# Patient Record
Sex: Male | Born: 1935 | Race: White | Hispanic: No | Marital: Married | Smoking: Former smoker
Health system: Southern US, Community
[De-identification: ages and names within clinical notes are randomized; demographics above are authoritative.]

## PROBLEM LIST (undated history)

## (undated) DIAGNOSIS — J449 Chronic obstructive pulmonary disease, unspecified: Secondary | ICD-10-CM

## (undated) DIAGNOSIS — I5022 Chronic systolic (congestive) heart failure: Secondary | ICD-10-CM

## (undated) DIAGNOSIS — N184 Chronic kidney disease, stage 4 (severe): Secondary | ICD-10-CM

## (undated) DIAGNOSIS — R918 Other nonspecific abnormal finding of lung field: Secondary | ICD-10-CM

## (undated) DIAGNOSIS — I2510 Atherosclerotic heart disease of native coronary artery without angina pectoris: Secondary | ICD-10-CM

## (undated) DIAGNOSIS — R1310 Dysphagia, unspecified: Secondary | ICD-10-CM

## (undated) DIAGNOSIS — I10 Essential (primary) hypertension: Secondary | ICD-10-CM

## (undated) DIAGNOSIS — Z86711 Personal history of pulmonary embolism: Secondary | ICD-10-CM

## (undated) DIAGNOSIS — I255 Ischemic cardiomyopathy: Secondary | ICD-10-CM

## (undated) DIAGNOSIS — I6529 Occlusion and stenosis of unspecified carotid artery: Secondary | ICD-10-CM

## (undated) DIAGNOSIS — R634 Abnormal weight loss: Secondary | ICD-10-CM

## (undated) HISTORY — DX: Occlusion and stenosis of unspecified carotid artery: I65.29

## (undated) HISTORY — DX: Personal history of pulmonary embolism: Z86.711

## (undated) HISTORY — DX: Abnormal weight loss: R63.4

## (undated) HISTORY — DX: Other nonspecific abnormal finding of lung field: R91.8

## (undated) HISTORY — DX: Ischemic cardiomyopathy: I25.5

## (undated) HISTORY — DX: Chronic systolic (congestive) heart failure: I50.22

## (undated) HISTORY — DX: Chronic obstructive pulmonary disease, unspecified: J44.9

## (undated) HISTORY — DX: Dysphagia, unspecified: R13.10

## (undated) HISTORY — PX: PROSTATE SURGERY: SHX751

---

## 1993-03-13 HISTORY — PX: CORONARY ARTERY BYPASS GRAFT: SHX141

## 2001-11-29 ENCOUNTER — Inpatient Hospital Stay (HOSPITAL_COMMUNITY): Admission: RE | Admit: 2001-11-29 | Discharge: 2001-12-01 | Payer: Self-pay | Admitting: Urology

## 2002-04-25 ENCOUNTER — Ambulatory Visit (HOSPITAL_COMMUNITY): Admission: RE | Admit: 2002-04-25 | Discharge: 2002-04-25 | Payer: Self-pay | Admitting: Internal Medicine

## 2002-05-16 ENCOUNTER — Ambulatory Visit (HOSPITAL_COMMUNITY): Admission: RE | Admit: 2002-05-16 | Discharge: 2002-05-16 | Payer: Self-pay | Admitting: General Surgery

## 2005-03-13 HISTORY — PX: CARDIAC CATHETERIZATION: SHX172

## 2005-04-14 ENCOUNTER — Ambulatory Visit: Payer: Self-pay | Admitting: Cardiology

## 2005-04-26 ENCOUNTER — Ambulatory Visit: Payer: Self-pay | Admitting: Cardiology

## 2005-05-03 ENCOUNTER — Inpatient Hospital Stay (HOSPITAL_BASED_OUTPATIENT_CLINIC_OR_DEPARTMENT_OTHER): Admission: RE | Admit: 2005-05-03 | Discharge: 2005-05-03 | Payer: Self-pay | Admitting: Internal Medicine

## 2005-05-03 ENCOUNTER — Ambulatory Visit: Payer: Self-pay | Admitting: Internal Medicine

## 2005-05-05 ENCOUNTER — Ambulatory Visit: Payer: Self-pay | Admitting: Cardiology

## 2005-05-10 ENCOUNTER — Ambulatory Visit: Payer: Self-pay | Admitting: Cardiology

## 2005-05-18 ENCOUNTER — Ambulatory Visit: Payer: Self-pay | Admitting: Cardiology

## 2005-10-20 ENCOUNTER — Ambulatory Visit: Payer: Self-pay | Admitting: Cardiology

## 2009-02-06 ENCOUNTER — Encounter: Payer: Self-pay | Admitting: Cardiology

## 2009-02-26 ENCOUNTER — Encounter: Payer: Self-pay | Admitting: Cardiology

## 2009-02-28 ENCOUNTER — Encounter: Payer: Self-pay | Admitting: Cardiology

## 2009-03-01 ENCOUNTER — Ambulatory Visit: Payer: Self-pay | Admitting: Cardiology

## 2009-03-01 ENCOUNTER — Encounter: Payer: Self-pay | Admitting: Cardiology

## 2009-03-02 ENCOUNTER — Encounter: Payer: Self-pay | Admitting: Cardiology

## 2009-03-03 ENCOUNTER — Encounter: Payer: Self-pay | Admitting: Cardiology

## 2009-03-04 ENCOUNTER — Encounter: Payer: Self-pay | Admitting: Cardiology

## 2009-03-19 ENCOUNTER — Ambulatory Visit: Payer: Self-pay | Admitting: Cardiology

## 2009-03-19 DIAGNOSIS — I6529 Occlusion and stenosis of unspecified carotid artery: Secondary | ICD-10-CM | POA: Insufficient documentation

## 2009-03-19 DIAGNOSIS — E785 Hyperlipidemia, unspecified: Secondary | ICD-10-CM | POA: Insufficient documentation

## 2009-03-19 DIAGNOSIS — I739 Peripheral vascular disease, unspecified: Secondary | ICD-10-CM

## 2009-03-19 DIAGNOSIS — R9430 Abnormal result of cardiovascular function study, unspecified: Secondary | ICD-10-CM | POA: Insufficient documentation

## 2009-03-19 DIAGNOSIS — I5042 Chronic combined systolic (congestive) and diastolic (congestive) heart failure: Secondary | ICD-10-CM | POA: Insufficient documentation

## 2009-03-19 DIAGNOSIS — I5041 Acute combined systolic (congestive) and diastolic (congestive) heart failure: Secondary | ICD-10-CM | POA: Insufficient documentation

## 2009-03-19 DIAGNOSIS — Z86718 Personal history of other venous thrombosis and embolism: Secondary | ICD-10-CM | POA: Insufficient documentation

## 2009-03-19 DIAGNOSIS — I10 Essential (primary) hypertension: Secondary | ICD-10-CM

## 2009-03-19 DIAGNOSIS — I129 Hypertensive chronic kidney disease with stage 1 through stage 4 chronic kidney disease, or unspecified chronic kidney disease: Secondary | ICD-10-CM

## 2009-03-19 DIAGNOSIS — Z951 Presence of aortocoronary bypass graft: Secondary | ICD-10-CM | POA: Insufficient documentation

## 2009-03-26 ENCOUNTER — Encounter: Payer: Self-pay | Admitting: Cardiology

## 2009-03-31 ENCOUNTER — Encounter: Payer: Self-pay | Admitting: Cardiology

## 2009-04-08 ENCOUNTER — Encounter: Payer: Self-pay | Admitting: Cardiology

## 2009-04-12 ENCOUNTER — Encounter: Payer: Self-pay | Admitting: Cardiology

## 2009-04-13 ENCOUNTER — Ambulatory Visit: Payer: Self-pay | Admitting: Cardiology

## 2009-04-13 DIAGNOSIS — N189 Chronic kidney disease, unspecified: Secondary | ICD-10-CM

## 2009-04-13 DIAGNOSIS — R109 Unspecified abdominal pain: Secondary | ICD-10-CM

## 2009-04-14 ENCOUNTER — Encounter: Payer: Self-pay | Admitting: Cardiology

## 2009-04-14 ENCOUNTER — Ambulatory Visit: Payer: Self-pay | Admitting: Cardiology

## 2009-04-14 ENCOUNTER — Inpatient Hospital Stay (HOSPITAL_COMMUNITY): Admission: EM | Admit: 2009-04-14 | Discharge: 2009-04-16 | Payer: Self-pay | Admitting: Emergency Medicine

## 2009-04-15 ENCOUNTER — Encounter: Payer: Self-pay | Admitting: Cardiology

## 2009-04-15 ENCOUNTER — Telehealth (INDEPENDENT_AMBULATORY_CARE_PROVIDER_SITE_OTHER): Payer: Self-pay | Admitting: *Deleted

## 2009-04-15 ENCOUNTER — Encounter (INDEPENDENT_AMBULATORY_CARE_PROVIDER_SITE_OTHER): Payer: Self-pay | Admitting: Internal Medicine

## 2009-04-16 ENCOUNTER — Encounter: Payer: Self-pay | Admitting: Cardiology

## 2009-04-16 ENCOUNTER — Encounter (INDEPENDENT_AMBULATORY_CARE_PROVIDER_SITE_OTHER): Payer: Self-pay | Admitting: *Deleted

## 2009-04-17 ENCOUNTER — Encounter: Payer: Self-pay | Admitting: Cardiology

## 2009-04-18 ENCOUNTER — Encounter: Payer: Self-pay | Admitting: Physician Assistant

## 2009-04-18 ENCOUNTER — Ambulatory Visit: Payer: Self-pay | Admitting: Cardiology

## 2009-05-18 ENCOUNTER — Ambulatory Visit: Payer: Self-pay | Admitting: Cardiology

## 2009-05-19 ENCOUNTER — Encounter: Payer: Self-pay | Admitting: Cardiology

## 2009-06-03 ENCOUNTER — Ambulatory Visit: Payer: Self-pay | Admitting: Cardiology

## 2009-06-03 DIAGNOSIS — I951 Orthostatic hypotension: Secondary | ICD-10-CM

## 2009-06-21 ENCOUNTER — Inpatient Hospital Stay (HOSPITAL_COMMUNITY): Admission: EM | Admit: 2009-06-21 | Discharge: 2009-06-24 | Payer: Self-pay | Admitting: Emergency Medicine

## 2009-06-21 ENCOUNTER — Ambulatory Visit: Payer: Self-pay | Admitting: Surgery

## 2009-06-21 ENCOUNTER — Encounter: Payer: Self-pay | Admitting: Cardiology

## 2009-06-22 ENCOUNTER — Encounter: Payer: Self-pay | Admitting: Cardiology

## 2009-06-22 ENCOUNTER — Ambulatory Visit: Payer: Self-pay | Admitting: Vascular Surgery

## 2009-06-23 ENCOUNTER — Encounter: Payer: Self-pay | Admitting: Cardiology

## 2009-06-24 ENCOUNTER — Encounter: Payer: Self-pay | Admitting: Cardiology

## 2009-06-28 ENCOUNTER — Ambulatory Visit (HOSPITAL_COMMUNITY): Admission: RE | Admit: 2009-06-28 | Discharge: 2009-06-28 | Payer: Self-pay | Admitting: Vascular Surgery

## 2009-07-12 ENCOUNTER — Ambulatory Visit: Payer: Self-pay | Admitting: Surgery

## 2010-04-12 NOTE — Miscellaneous (Signed)
Summary: Orders Update  Clinical Lists Changes  Orders: Added new Referral order of CT Scan  (CT Scan) - Signed 

## 2010-04-12 NOTE — Letter (Signed)
Summary: Discharge Summary  Discharge Summary   Imported By: Dorise Hiss 06/28/2009 15:25:12  _____________________________________________________________________  External Attachment:    Type:   Image     Comment:   External Document

## 2010-04-12 NOTE — Assessment & Plan Note (Signed)
Summary: Brett Baldwin   Visit Type:  Follow-up Primary Provider:  Linna Darner   History of Present Illness: the patient is a 77 year old male is coronary artery disease, status post coronary bypass grafting. The patient was last evaluated on March 19, 2009. He presents for a post hospital visit. He was admitted with a sensation of choking and cough. During the hospital he had an endoscopy performed and states that after this he felt better. No esophageal dilatation however was done. A mild elevation in his cardiac troponin was noted. Cardiolite study showed an old lateral scar but no significant ischemia. Ejection fraction was 45%. The patient also has a history of urosepsis and recurrent UTI. No abnormalities on cystoscopy have been found.  When I saw the patient he office he was noted to have a significant carotid bruit on the left side. He was briefly evaluated in 1995 at [facility] for high-grade occlusion in the right carotid artery, versus possible occlusion and was told that there was no surgical option. I referred the patient for a CTA of the head and neck. He was found to have the right common carotid artery and right internal carotid artery to be occluded. No contiguous string sign was detected. There was prominent irregular calcified plaque noted in the left carotid artery with an estimated 60% diameter stenosis. There was also 75% stenosis in the proximal left vertebral artery. he has a followup appointment scheduled with vascular surgery on February 14.Incidentally on CT scan he was found to have pulmonary nodules in the right apex respectively 3.6 mm and 3.4 mm the latter in the left lung. Recommendation was to followup with a CT scan in one year.  From a cardiac perspective the patient however currently denies any chest pain he has no orthopnea or PND. His NYHA class 1/2. He does report left flank pain. He denies any fever or chills.he also reports bilateral hip pain. The patient reports fatigue and  generalized weakness.the patient reports that he has had an abnormal ultrasound of the kidneys although we do not have the results available. His most recent creatinine was 2.0.  Current Problems (verified): 1)  Pvd  (ICD-443.9) 2)  Essential Hypertension, Benign  (ICD-401.1) 3)  Carotid Artery Disease  (ICD-433.10) 4)  Nonspecific Abnormal Unspec Cv Function Study  (ICD-794.30) 5)  Chronic Comb Systolic&diastolic Heart Failure  (ICD-428.42) 6)  CHF  (ICD-428.0) 7)  Hyperlipidemia  (ICD-272.4) 8)  Htn Ckd Uns W/ckd Stage I Thru Stage Iv/uns  (ICD-403.90) 9)  Postsurgical Aortocoronary Bypass Status  (ICD-V45.81) 10)  Personal History, Venous Thrombosis and Embolism  (ICD-V12.51) 11)  Occlusion&stenos Carotid Art w/o Mention Infarct  (ICD-433.10)  Current Medications (verified): 1)  Aspir-Trin 325 Mg Tbec (Aspirin) .... Take 1 Tablet By Mouth Once A Day 2)  Simvastatin 80 Mg Tabs (Simvastatin) .... Take 1 Tablet By Mouth Once A Day 3)  Fioricet 50-325-40 Mg Tabs (Butalbital-Apap-Caffeine) .... Two Times A Day As Needed 4)  Furosemide 40 Mg Tabs (Furosemide) .... Take 1/2 Tab Daily (20mg ) 5)  Bisoprolol Fumarate 5 Mg Tabs (Bisoprolol Fumarate) .... Take 1/2 Tablet By Mouth Once A Day 6)  Flomax 0.4 Mg Caps (Tamsulosin Hcl) .... Take 1 Tablet By Mouth Once A Day 7)  Imdur 30 Mg Xr24h-Tab (Isosorbide Mononitrate) .... Take 1 Tablet By Mouth Once A Day 8)  Losartan Potassium 50 Mg Tabs (Losartan Potassium) .... Take 1 Tablet By Mouth Once A Day 9)  Flonase 50 Mcg/act Susp (Fluticasone Propionate) .... One Spray Each Nostril  Two Times A Day 10)  Zyrtec Allergy 10 Mg Tabs (Cetirizine Hcl) .... Take 1 Tablet By Mouth Once A Day 11)  Amlodipine Besylate 2.5 Mg Tabs (Amlodipine Besylate) .... Take 1 Tablet By Mouth Once A Day  Allergies (verified): No Known Drug Allergies  Past History:  Past Medical History: Last updated: 03/19/2009 Hypertension Hx of Coronary disease with cath in 2007  revealing patient grafts CABG done in 1995 Hx of PE in the past but no pulmonary embolus on the current CT Scan as of 03/01/09 carotid artery stenosis COPD Swallowing difficulties Hx. of EF 30%  Past Surgical History: Last updated: 03/19/2009 CABG  1995 CAD H/O bypass prostate infections  Family History: Last updated: 03/19/2009 NEGATIVE FOR SEVERE CORONARY DISEAsE  Social History: Last updated: 03/19/2009 Married  Alcohol Use - no Drug Use - no  Risk Factors: Smoking Status: quit (03/19/2009)  Review of Systems       The patient complains of fatigue, decreased hearing, prolonged cough, muscle weakness, and joint pain.  The patient denies malaise, fever, weight gain/loss, vision loss, hoarseness, chest pain, palpitations, shortness of breath, wheezing, sleep apnea, coughing up blood, abdominal pain, blood in stool, nausea, vomiting, diarrhea, heartburn, incontinence, blood in urine, leg swelling, rash, skin lesions, headache, fainting, dizziness, depression, anxiety, enlarged lymph nodes, easy bruising or bleeding, and environmental allergies.    Vital Signs:  Patient profile:   77 year old male Weight:      150 pounds Pulse rate:   66 / minute BP sitting:   167 / 68  (right arm)  Vitals Entered By: Dreama Saa, CNA (April 13, 2009 9:54 AM)  Physical Exam  Additional Exam:  General: Well-developed, well-nourished in no distress head: Normocephalic and atraumatic eyes PERRLA/EOMI intact, conjunctiva and lids normal nose: No deformity or lesions mouth normal dentition, normal posterior pharynx neck: Supple, no JVD.  No masses, thyromegaly or abnormal cervical nodes. Left carotid bruit lungs: Normal breath sounds bilaterally without wheezing.  Normal percussion heart: regular rate and rhythm with normal S1 and S2, no S3 or S4.  PMI is normal.  No pathological murmurs abdomen: Normal bowel sounds, abdomen is soft and nontender without masses, organomegaly or hernias  noted.  No hepatosplenomegaly musculoskeletal: Back normal, normal gait muscle strength and tone normal pulsus:1+ dorsalis pedis and posterior tibial pulses. Extremities: No peripheral pitting edema neurologic: Alert and oriented x 3 skin: Intact without lesions or rashes cervical nodes: No significant adenopathy psychologic: Normal affect    Impression & Recommendations:  Problem # 1:  ESSENTIAL HYPERTENSION, BENIGN (ICD-401.1) the patient's blood pressure appears to be poorly controlled today. However I requested several blood pressure readings before making changes. In light of his renal dysfunction, the patient will also be referred to nephrology. I anticipate that we will make further changes. Although the patient is on Cozaar this does not cause a further increase in his creatinine. The patient does have significant LV dysfunction with an ejection fraction of 35% and benefits from an ARB. Lisinopril was discontinued due to possible cough, although the latter may be multifactorial. His updated medication list for this problem includes:    Aspir-trin 325 Mg Tbec (Aspirin) .Marland Kitchen... Take 1 tablet by mouth once a day    Furosemide 40 Mg Tabs (Furosemide) .Marland Kitchen... Take 1/2 tab daily (20mg )    Bisoprolol Fumarate 5 Mg Tabs (Bisoprolol fumarate) .Marland Kitchen... Take 1/2 tablet by mouth once a day    Losartan Potassium 50 Mg Tabs (Losartan potassium) .Marland Kitchen... Take  1 tablet by mouth once a day    Amlodipine Besylate 2.5 Mg Tabs (Amlodipine besylate) .Marland Kitchen... Take 1 tablet by mouth once a day  Problem # 2:  CAROTID ARTERY DISEASE (ICD-433.10) the patient has followup scheduled with vascular surgery. His updated medication list for this problem includes:    Aspir-trin 325 Mg Tbec (Aspirin) .Marland Kitchen... Take 1 tablet by mouth once a day  Problem # 3:  NONSPECIFIC ABNORMAL UNSPEC CV FUNCTION STUDY (ICD-794.30) the patient had an abnormal Cardiolite stress study but with evidence of old lateral scar. He denies any substernal  chest pain. It appears to have no ischemia.  Problem # 4:  CHRONIC COMB SYSTOLIC&DIASTOLIC HEART FAILURE (ICD-428.42) the patient has LV dysfunction with an ejection fraction of 35%. We will recheck an echocardiogram in 3 months to reassess his LV function. Currently however he has no heart failure symptoms. His updated medication list for this problem includes:    Aspir-trin 325 Mg Tbec (Aspirin) .Marland Kitchen... Take 1 tablet by mouth once a day    Furosemide 40 Mg Tabs (Furosemide) .Marland Kitchen... Take 1/2 tab daily (20mg )    Bisoprolol Fumarate 5 Mg Tabs (Bisoprolol fumarate) .Marland Kitchen... Take 1/2 tablet by mouth once a day    Imdur 30 Mg Xr24h-tab (Isosorbide mononitrate) .Marland Kitchen... Take 1 tablet by mouth once a day    Losartan Potassium 50 Mg Tabs (Losartan potassium) .Marland Kitchen... Take 1 tablet by mouth once a day    Amlodipine Besylate 2.5 Mg Tabs (Amlodipine besylate) .Marland Kitchen... Take 1 tablet by mouth once a day  Problem # 5:  HYPERLIPIDEMIA (ICD-272.4) the patient is on high-dose statin drug therapy. He can follow up with his primary care physician regarding liver function tests and lipid panel. His updated medication list for this problem includes:    Simvastatin 80 Mg Tabs (Simvastatin) .Marland Kitchen... Take 1 tablet by mouth once a day  Problem # 6:  FLANK PAIN, LEFT (ICD-789.09) have ordered urinalysis and culture and sensitivity due to the patient's history of recurrent UTIs. Orders: T-Urinalysis ([id number])  Problem # 7:  CHRONIC KIDNEY DISEASE UNSPECIFIED (ICD-585.9) the patient will be referred to nephrology. Creatinine has remained stable around 2 on the current medical regimen.  Patient Instructions: 1)  2D-Echo in 3 months (before next visit) 2)  Go to [facility] for routine urinalysis 3)  Follow up in  3 months.

## 2010-04-12 NOTE — Consult Note (Signed)
Summary: [facility]   [facility]   Imported By: Roderic Ovens 05/04/2009 14:13:00  _____________________________________________________________________  External Attachment:    Type:   Image     Comment:   External Document

## 2010-04-12 NOTE — Progress Notes (Signed)
Summary: POSITIVE UTI  Phone Note From Other Clinic Call back at [phone]   Caller: Mckinley Jewel Summary of Call: positive UTI,report to be faxed. Initial call taken by: Carlye Grippe,  April 15, 2009 8:14 AM

## 2010-04-12 NOTE — Consult Note (Signed)
Summary: Consultation Report  Consultation Report   Imported By: Dorise Hiss 06/28/2009 15:28:20  _____________________________________________________________________  External Attachment:    Type:   Image     Comment:   External Document

## 2010-04-12 NOTE — Letter (Signed)
Summary: External Correspondence/ FAXED VASCULAR&VEIN  External Correspondence/ FAXED VASCULAR&VEIN   Imported By: Dorise Hiss 04/20/2009 09:35:01  _____________________________________________________________________  External Attachment:    Type:   Image     Comment:   External Document

## 2010-04-12 NOTE — Assessment & Plan Note (Signed)
Summary: eph-post [facility] per Myrtis Ser 2 wks   Visit Type:  hospital follow-up Primary Provider:  Linna Darner  CC:  hospital follow-up visit.  History of Present Illness: the patient is a 77 year old male with history of coronary artery disease status post coronary bypass grafting. The patient was seen in the hospital by Dr. Myrtis Ser. He came in with the sensation of choking and cough. He had an endoscopy performed and states that after this he felt better. It does not appear to have an esophageal dilatation done. Cardiac troponins were drawn and were respectively 0.16, weight 1:30, 0.13. The patient did not report any substernal chest pain. There were no acute ischemic EKG changes. Dr. Myrtis Ser did decide to perform a Cardiolite scan. This study was abnormal with old lateral scar but significant ischemia he in several segments. Ejection fraction was approximately 45%. The patient was referred to me for followup. He recently was seen by the urologist after several episodes of urosepsis and recurrent UTI. He had a cystoscopy performed no acute abnormalities. Of note is also the patient has history of carotid artery disease with complete occlusion of the right carotid and a high grade lesion in the left carotid. This was diagnosed in 1995 it was felt that the patient had no surgical options. This decision was rendered by [facility]. The patient a catheterization in 2007 with patent grafts with significant distal disease.  The patient reports still a chronic cough. He also significant nasal congestion. He reports no claudication. He denies any chest pain but at rest or on exertion. He is an NYHA class 1/2. He denies any orthopnea or PND.  Preventive Screening-Counseling & Management  Alcohol-Tobacco     Smoking Status: quit     Year Quit: 1978  Clinical Review Panels:  CXR CXR results  CHEST - 2 VIEW                             Comparison: 12/23/2008                   Findings: Prior CABG.  Heart and mediastinal contours are within         normal limits.  No focal opacities or effusions.  No acute bony         abnormality.                   IMPRESSION:         No acute cardiopulmonary disease.                              ** REPORT SIGNED IN OTHER VENDOR SYSTEM  **                        Reported By: Cyndie Chime, MD      (02/28/2009)  Cardiac Imaging Cardiac Cath Findings                              CARDIAC CATHETERIZATION  Left ventriculogram done the RAO position using a hand injection to minimize  dye showed an EF of about 35% with inferior akinesis.   ASSESSMENT:  1.  Severe three-vessel native coronary disease.  2.  All three of his bypass grafts were open with severe diffuse distal      disease in  the native vessels as well as branch vessel disease.  3.  Left ventricular ejection fraction appears to be 35% by hand injection      of the left ventricular. The left ventricular filling pressure is      normal.   PLAN:  We will continue with medical therapy as per Dr. Andee Lineman. I would  consider obtaining echocardiogram to further evaluate the ejection fraction.   Arvilla Meres, M.D. [facility] (05/03/2005)    Current Medications (verified): 1)  Aspir-Trin 325 Mg Tbec (Aspirin) .... Take 1 Tablet By Mouth Once A Day 2)  Simvastatin 80 Mg Tabs (Simvastatin) .... Take 1 Tablet By Mouth Once A Day 3)  Fioricet 50-325-40 Mg Tabs (Butalbital-Apap-Caffeine) .... Two Times A Day As Needed 4)  Furosemide 40 Mg Tabs (Furosemide) .... Take 1/2 Tab Daily (20mg ) 5)  Bisoprolol Fumarate 5 Mg Tabs (Bisoprolol Fumarate) .... Take 1/2 Tablet By Mouth Once A Day 6)  Flomax 0.4 Mg Caps (Tamsulosin Hcl) .... Take 1 Tablet By Mouth Once A Day 7)  Imdur 30 Mg Xr24h-Tab (Isosorbide Mononitrate) .... Take 1 Tablet By Mouth Once A Day 8)  Losartan Potassium 50 Mg Tabs (Losartan Potassium) .... Take 1 Tablet By Mouth Once A Day 9)  Flonase 50 Mcg/act Susp (Fluticasone Propionate) ....  One Spray Each Nostril Two Times A Day 10)  Zyrtec Allergy 10 Mg Tabs (Cetirizine Hcl) .... Take 1 Tablet By Mouth Once A Day  Allergies (verified): No Known Drug Allergies  Comments:  Nurse/Medical Assistant: The patient's medications and allergies were reviewed with the patient and were updated in the Medication and Allergy Lists. List brought.  Past History:  Past Medical History: Last updated: 03/19/2009 Hypertension Hx of Coronary disease with cath in 2007 revealing patient grafts CABG done in 1995 Hx of PE in the past but no pulmonary embolus on the current CT Scan as of 03/01/09 carotid artery stenosis COPD Swallowing difficulties Hx. of EF 30%  Past Surgical History: Last updated: 03/19/2009 CABG  1995 CAD H/O bypass prostate infections  Family History: Last updated: 03/19/2009 NEGATIVE FOR SEVERE CORONARY DISEAsE  Social History: Last updated: 03/19/2009 Married  Alcohol Use - no Drug Use - no  Risk Factors: Smoking Status: quit (03/19/2009)  Social History: Smoking Status:  quit  Review of Systems       The patient complains of blood in urine.  The patient denies fatigue, malaise, fever, weight gain/loss, vision loss, decreased hearing, hoarseness, chest pain, palpitations, shortness of breath, prolonged cough, wheezing, sleep apnea, coughing up blood, abdominal pain, blood in stool, nausea, vomiting, diarrhea, heartburn, incontinence, muscle weakness, joint pain, leg swelling, rash, skin lesions, headache, fainting, dizziness, depression, anxiety, enlarged lymph nodes, easy bruising or bleeding, and environmental allergies.         swallowing difficulty  Vital Signs:  Patient profile:   77 year old male Height:      67 inches Weight:      147 pounds Pulse rate:   62 / minute BP sitting:   161 / 70  (left arm) Cuff size:   regular  Vitals Entered By: Carlye Grippe (March 19, 2009 11:30 AM) CC: hospital follow-up visit   Physical  Exam  Additional Exam:  General: Well-developed, well-nourished in no distress head: Normocephalic and atraumatic eyes PERRLA/EOMI intact, conjunctiva and lids normal nose: No deformity or lesions mouth normal dentition, normal posterior pharynx neck: Supple, no JVD.  No masses, thyromegaly or abnormal cervical nodes.  The patient has a  right-sided carotid bruit no bruit is heard on the left side. lungs: Normal breath sounds bilaterally without wheezing.  Normal percussion heart: regular rate and rhythm with normal S1 and S2, no S3 or S4.  PMI is normal.  No pathological murmurs abdomen: Normal bowel sounds, abdomen is soft and nontender without masses, organomegaly or hernias noted.  No hepatosplenomegaly musculoskeletal: Back normal, normal gait muscle strength and tone normal pulsus: Pulse is normal in all 4 extremities Extremities: No peripheral pitting edema neurologic: Alert and oriented x 3 skin: Intact without lesions or rashes cervical nodes: No significant adenopathy psychologic: Normal affect    Impression & Recommendations:  Problem # 1:  ESSENTIAL HYPERTENSION, BENIGN (ICD-401.1) the patient has poorly controlled blood pressure. I made significant changes in his medical regimen. In particular he had discontinued low dose lisinopril in favor of higher dose Cozaar. The former may be causing the patient to cough. Also gave him prescription for Flonase and Zyrtec as he clinically doubtful he has a postnasal drip. His updated medication list for this problem includes:    Aspir-trin 325 Mg Tbec (Aspirin) .Marland Kitchen... Take 1 tablet by mouth once a day    Furosemide 40 Mg Tabs (Furosemide) .Marland Kitchen... Take 1/2 tab daily (20mg )    Bisoprolol Fumarate 5 Mg Tabs (Bisoprolol fumarate) .Marland Kitchen... Take 1/2 tablet by mouth once a day    Losartan Potassium 50 Mg Tabs (Losartan potassium) .Marland Kitchen... Take 1 tablet by mouth once a day  Problem # 2:  CAROTID ARTERY DISEASE (ICD-433.10) the patient has severe  carotid artery disease .he has apparently a known occlusion of the left side an 80% right-sided carotid artery disease.  The patient will be scheduled for a CT scan/angiography of the head neck and upper chest.  This will provide a roadmap of an evaluation of his carotid arteries and cerebral vessels.  The patient will need a referral likely to vascular surgery. His updated medication list for this problem includes:    Aspir-trin 325 Mg Tbec (Aspirin) .Marland Kitchen... Take 1 tablet by mouth once a day  Problem # 3:  NONSPECIFIC ABNORMAL UNSPEC CV FUNCTION STUDY (ICD-794.30) although the patient has an abnormal cardiovascular function study he currently has no chest pain.  He had catheterization done in 2007 which showed patent grafts but significant distal disease.  He also has an ejection fraction of 35%.the patient will be started on indoor in part for blood pressure control.  Given his low ejection fraction we will discontinue Pletal.  He has no evidence of claudication in any event.  I also changed the patient's lisinopril to generic Cozaar.  He does report cough although I think this may be likely secondary to postnasal drip  Problem # 4:  CHRONIC COMB SYSTOLIC&DIASTOLIC HEART FAILURE (ICD-428.42) ejection fraction 35% with adjustments in medications in addition to Cozaar to 50 monos p.o. daily.  Given low ejection fraction Pletal will be discontinued. His updated medication list for this problem includes:    Aspir-trin 325 Mg Tbec (Aspirin) .Marland Kitchen... Take 1 tablet by mouth once a day    Furosemide 40 Mg Tabs (Furosemide) .Marland Kitchen... Take 1/2 tab daily (20mg )    Bisoprolol Fumarate 5 Mg Tabs (Bisoprolol fumarate) .Marland Kitchen... Take 1/2 tablet by mouth once a day    Imdur 30 Mg Xr24h-tab (Isosorbide mononitrate) .Marland Kitchen... Take 1 tablet by mouth once a day    Losartan Potassium 50 Mg Tabs (Losartan potassium) .Marland Kitchen... Take 1 tablet by mouth once a day  Problem # 5:  PVD (ICD-443.9)  No claudication. D/C Pletal with LV dysfunction.    Problem # 6:  POSTSURGICAL AORTOCORONARY BYPASS STATUS (ICD-V45.81) stable with no recurrent angina.  Patient Instructions: 1)  Imdur 30mg  daily 2)  Stop Pletal 3)  Stop Lisinopril 4)  Change to generic Cozaar 50mg  daily 5)  Change to Lasix 20mg  daily (1/2 tab) 6)  CT Scan of head, neck, and upper chest 7)  CTA to evaluate carotid arteries/cerebral vessels 8)  Follow up in  1 month  Prescriptions: FLONASE 50 MCG/ACT SUSP (FLUTICASONE PROPIONATE) one spray each nostril two times a day  #1 x 1   Entered by:   Hoover Brunette, LPN   Authorized by:   Lewayne Bunting, MD, [facility]   Signed by:   Hoover Brunette, LPN on 98/01/9146   Method used:   Electronically to        [facility] (retail)       [address]       [place], [place]  [address]       Ph: [phone]       Fax: [phone]   RxID:   [id number] LOSARTAN POTASSIUM 50 MG TABS (LOSARTAN POTASSIUM) Take 1 tablet by mouth once a day  #30 x 6   Entered by:   Hoover Brunette, LPN   Authorized by:   Lewayne Bunting, MD, [facility]   Signed by:   Hoover Brunette, LPN on 03/47/4259   Method used:   Electronically to        [facility] (retail)       [address]       [place], [place]  [address]       Ph: [phone]       Fax: [phone]   RxID:   [id number] IMDUR 30 MG XR24H-TAB (ISOSORBIDE MONONITRATE) Take 1 tablet by mouth once a day  #30 x 6   Entered by:   Hoover Brunette, LPN   Authorized by:   Lewayne Bunting, MD, [facility]   Signed by:   Hoover Brunette, LPN on 54/27/0623   Method used:   Electronically to        [facility] (retail)       [address]       [place], [place]  [address]       Ph: [phone]       Fax: [phone]   RxID:   [id number]

## 2010-04-12 NOTE — Letter (Signed)
Summary: Discharge Summary  Discharge Summary   Imported By: Zachary George 03/19/2009 09:38:29  _____________________________________________________________________  External Attachment:    Type:   Image     Comment:   External Document

## 2010-04-12 NOTE — Consult Note (Signed)
Summary: CARDIOLOGY CONSULT/[facility]  [facility]OLOGY [facility]SULT/[facility]   Imported By: Zachary George 03/19/2009 09:37:59  _____________________________________________________________________  External Attachment:    Type:   Image     Comment:   External Document

## 2010-04-12 NOTE — Consult Note (Signed)
Summary: CARDIOLOGY CONSULT/ [facility]  [facility]OLOG[facility]/ [facility]   Imported By: Zachary George 06/02/2009 14:00:20  _____________________________________________________________________  External Attachment:    Type:   Image     Comment:   External Document

## 2010-04-12 NOTE — Miscellaneous (Signed)
Summary: Orders Update  Clinical Lists Changes  Orders: Added new Test order of T-BUN ([id number]) - Signed Added new Test order of T-Creatine ([id number]) - Signed 

## 2010-04-12 NOTE — Assessment & Plan Note (Signed)
Summary: eph d/c mmh 04/22/2009   Visit Type:  Follow-up Primary Provider:  Linna Darner  CC:  follow-up visit.  History of Present Illness: the patient is an 77 year old male with a history of ischemic cardiomyopathy status post coronary bypass grafting in 1995. He had a recent Cardiolite indicating an old lateral scar but no ischemia with an ejection fraction of 45%. More recently an echocardiogram demonstrates an ejection fraction of 50-55%.  The patient has recently required 2 consecutive hospitalizations for diarrhea, urinary tract infection both complicated by orthostatic hypotension. The patient had frank syncope. He also had cardiac monitor done in followup to make sure that he does not have any ventricular arrhythmias. Cardiac monitor demonstrated essentially normal sinus rhythm with a single run of 6 beats of ventricular tachycardia but no associated symptoms. The patient also significant peripheral vascular disease and has been evaluated with a CT angiogram of the neck. The patient appears to have an occluded right common carotid artery/ICA versus without a string sign and a 50-69% left ICA stenosis.however interestingly on exam the patient still has a right carotid bruit albeit soft. Patient was referred to vascular surgery but was unable to keep this appointment due to his recent hospitalizations.  The patient continues to complain of recurrent UTIs and remains on antibiotics. He also is continuous left flank pain. He denies however any recurrent syncope. He denies any chest pain, palpitations orthopnea PND.  Preventive Screening-Counseling & Management  Alcohol-Tobacco     Smoking Status: quit     Year Quit: 1978  Current Medications (verified): 1)  Aspir-Trin 325 Mg Tbec (Aspirin) .... Take 1 Tablet By Mouth Once A Day 2)  Simvastatin 80 Mg Tabs (Simvastatin) .... Take 1 Tablet By Mouth Once A Day 3)  Fioricet 50-325-40 Mg Tabs (Butalbital-Apap-Caffeine) .... Two Times A Day As  Needed 4)  Furosemide 40 Mg Tabs (Furosemide) .... Take 1/2 Tab Daily (20mg ) 5)  Bisoprolol Fumarate 5 Mg Tabs (Bisoprolol Fumarate) .... Take 1/2 Tablet By Mouth Once A Day 6)  Flomax 0.4 Mg Caps (Tamsulosin Hcl) .... Take 1 Tablet By Mouth Once A Day 7)  Losartan Potassium 50 Mg Tabs (Losartan Potassium) .... Take 1 Tablet By Mouth Once A Day 8)  Flonase 50 Mcg/act Susp (Fluticasone Propionate) .... One Spray Each Nostril Two Times A Day 9)  Zyrtec Allergy 10 Mg Tabs (Cetirizine Hcl) .... Take 1 Tablet By Mouth Once A Day 10)  Cephalexin 250 Mg Caps (Cephalexin) .... Take 1 Tablet By Mouth Four Times A Day 11)  Fish Oil 1000 Mg Caps (Omega-3 Fatty Acids) .... Take 1 Tablet By Mouth Once A Day 12)  Vitamin D3 400 Unit Tabs (Cholecalciferol) .... Take 1 Tablet By Mouth Once A Day 13)  Hydrocodone-Acetaminophen 5-500 Mg Tabs (Hydrocodone-Acetaminophen) .... Take 1 Tablet By Mouth Once A Day As Needed  Allergies (verified): No Known Drug Allergies  Comments:  Nurse/Medical Assistant: The patient's medications and allergies were reviewed with the patient and were updated in the Medication and Allergy Lists. List reviewed.  Past History:  Past Surgical History: Last updated: 03/19/2009 CABG  1995 CAD H/O bypass prostate infections  Family History: Last updated: 03/19/2009 NEGATIVE FOR SEVERE CORONARY DISEAsE  Social History: Last updated: 03/19/2009 Married  Alcohol Use - no Drug Use - no  Risk Factors: Smoking Status: quit (06/03/2009)  Past Medical History: Hypertension Hx of Coronary disease with cath in 2007 revealing patient grafts CABG done in 1995 Hx of PE in the past but no pulmonary  embolus on the current CT Scan as of 03/01/09 carotid artery stenosis: status post CT of the head and neck. The right common carotid artery and right internal carotid artery were occluded. No contiguous string sign was detected. There was prominent irregular calcified plaque noted in the  left carotid artery with an estimated 60% diameter stenosis. There was also 75% stenosis in the proximal left vertebral artery. COPD and pulmonary nodules. Swallowing difficulties Hx. of EF 30%  Review of Systems       The patient complains of muscle weakness.  The patient denies fatigue, malaise, fever, weight gain/loss, vision loss, decreased hearing, hoarseness, chest pain, palpitations, shortness of breath, prolonged cough, wheezing, sleep apnea, coughing up blood, abdominal pain, blood in stool, nausea, vomiting, diarrhea, heartburn, incontinence, blood in urine, joint pain, leg swelling, rash, skin lesions, headache, fainting, dizziness, depression, anxiety, enlarged lymph nodes, easy bruising or bleeding, and environmental allergies.    Vital Signs:  Patient profile:   77 year old male Height:      67 inches Weight:      153 pounds Pulse rate:   56 / minute Pulse (ortho):   60 / minute BP sitting:   166 / 75  (left arm) BP standing:   146 / 67 Cuff size:   regular  Vitals Entered By: Carlye Grippe (June 03, 2009 8:59 AM)  Serial Vital Signs/Assessments:  Time      Position  BP       Pulse  Resp  Temp     By 9:45 AM   Lying LA  168/69   56                    Lydia Anderson 9:45 AM   Sitting   147/72   64                    Lydia Anderson 9:45 AM   Standing  146/67   60                    Lydia Anderson 9:47 AM   Standing  166/69   64                    Lydia Anderson 9:51 AM   Standing  170/68   66                    Carlye Grippe  CC: follow-up visit   Physical Exam  Additional Exam:  General: Well-developed, well-nourished in no distress head: Normocephalic and atraumatic eyes PERRLA/EOMI intact, conjunctiva and lids normal nose: No deformity or lesions mouth normal dentition, normal posterior pharynx neck: Supple, no JVD.  No masses, thyromegaly or abnormal cervical nodes. Left carotid bruit harsh and a very soft right carotid bruit. lungs: Normal breath sounds  bilaterally without wheezing.  Normal percussion heart: regular rate and rhythm with normal S1 and S2, no S3 or S4.  PMI is normal.  No pathological murmurs abdomen: Normal bowel sounds, abdomen is soft and nontender without masses, organomegaly or hernias noted.  No hepatosplenomegaly musculoskeletal: Back normal, normal gait muscle strength and tone normal pulsus:1+ dorsalis pedis and posterior tibial pulses. Extremities: No peripheral pitting edema neurologic: Alert and oriented x 3 skin: Intact without lesions or rashes cervical nodes: No significant adenopathy psychologic: Normal affect    Impression & Recommendations:  Problem # 1:  CAROTID ARTERY DISEASE (ICD-433.10) I doubt the patient is a surgical candidate  at this point in time, but I did refer the patient for second opinion for vascular surgery. His updated medication list for this problem includes:    Aspir-trin 325 Mg Tbec (Aspirin) .Marland Kitchen... Take 1 tablet by mouth once a day  Problem # 2:  POSTURAL HYPOTENSION (ICD-458.0) orthostatics were performed today. There was no evidence of orthostatic blood pressure drop. The patient's syncope appear to be related to volume depletion was setting off infection. No definite arrhythmias noted by CardioNet monitor an ejection fraction now normalized to 55%.  Problem # 3:  FLANK PAIN, LEFT (ICD-789.09) followup which urology  Problem # 4:  CHF (ICD-428.0) no evidence of volume overload. No recurrent chest pain. The following medications were removed from the medication list:    Imdur 30 Mg Xr24h-tab (Isosorbide mononitrate) .Marland Kitchen... Take 1 tablet by mouth once a day    Amlodipine Besylate 2.5 Mg Tabs (Amlodipine besylate) .Marland Kitchen... Take 1 tablet by mouth once a day His updated medication list for this problem includes:    Aspir-trin 325 Mg Tbec (Aspirin) .Marland Kitchen... Take 1 tablet by mouth once a day    Furosemide 40 Mg Tabs (Furosemide) .Marland Kitchen... Take 1/2 tab daily (20mg )    Bisoprolol Fumarate 5 Mg  Tabs (Bisoprolol fumarate) .Marland Kitchen... Take 1/2 tablet by mouth once a day    Losartan Potassium 50 Mg Tabs (Losartan potassium) .Marland Kitchen... Take 1 tablet by mouth once a day  Patient Instructions: 1)  [facility] - will call this afternoon with appt.  2)  Follow up in  6 months

## 2010-04-12 NOTE — Miscellaneous (Signed)
Summary: Orders Update - bmet  Clinical Lists Changes  Orders: Added new Test order of T-Basic Metabolic Panel ([id number]) - Signed 

## 2010-04-12 NOTE — Letter (Signed)
Summary: [facility]  [facility]   Imported By: Zachary George 06/02/2009 14:29:04  _____________________________________________________________________  External Attachment:    Type:   Image     Comment:   External Document

## 2010-04-12 NOTE — Procedures (Signed)
Summary: Holter and Event/ [facility] END OF SERVICE SUMMARY REPORT  [facility]/ [facility] END OF SERVICE SUMMARY REPORT   Imported By: Dorise Hiss 05/19/2009 16:28:27  _____________________________________________________________________  External Attachment:    Type:   Image     Comment:   External Document

## 2010-05-31 LAB — POCT I-STAT, CHEM 8
Chloride: 109 mEq/L (ref 96–112)
Creatinine, Ser: 1.6 mg/dL — ABNORMAL HIGH (ref 0.4–1.5)
Potassium: 4.5 mEq/L (ref 3.5–5.1)
TCO2: 27 mmol/L (ref 0–100)

## 2010-06-01 LAB — BASIC METABOLIC PANEL
CO2: 25 mEq/L (ref 19–32)
CO2: 24 mEq/L (ref 19–32)
CO2: 27 mEq/L (ref 19–32)
Calcium: 8.6 mg/dL (ref 8.4–10.5)
Calcium: 8 mg/dL — ABNORMAL LOW (ref 8.4–10.5)
Chloride: 106 mEq/L (ref 96–112)
Creatinine, Ser: 1.46 mg/dL (ref 0.4–1.5)
GFR calc Af Amer: >60 mL/min (ref >60)
GFR calc Af Amer: >60 mL/min (ref >60)
GFR calc non Af Amer: 50 mL/min — ABNORMAL LOW (ref >60)
GFR calc non Af Amer: 56 mL/min — ABNORMAL LOW (ref >60)
Glucose, Bld: 89 mg/dL (ref 70–99)
Glucose, Bld: 83 mg/dL (ref 70–99)
Glucose, Bld: 89 mg/dL (ref 70–99)
Potassium: 3.4 mEq/L — ABNORMAL LOW (ref 3.5–5.1)
Potassium: 4.3 mEq/L (ref 3.5–5.1)
Potassium: 4.5 mEq/L (ref 3.5–5.1)
Sodium: 135 mEq/L (ref 135–145)
Sodium: 140 mEq/L (ref 135–145)
Sodium: 141 mEq/L (ref 135–145)

## 2010-06-01 LAB — CBC
HCT: 39 % (ref 39.0–52.0)
HCT: 35.4 % — ABNORMAL LOW (ref 39.0–52.0)
Hemoglobin: 11.8 g/dL — ABNORMAL LOW (ref 13.0–17.0)
Hemoglobin: 11.8 g/dL — ABNORMAL LOW (ref 13.0–17.0)
Hemoglobin: 12.2 g/dL — ABNORMAL LOW (ref 13.0–17.0)
Hemoglobin: 12.3 g/dL — ABNORMAL LOW (ref 13.0–17.0)
MCHC: 34.1 g/dL (ref 30.0–36.0)
MCHC: 34.2 g/dL (ref 30.0–36.0)
MCHC: 34.4 g/dL (ref 30.0–36.0)
MCHC: 33.5 g/dL (ref 30.0–36.0)
MCV: 93 fL (ref 78.0–100.0)
Platelets: 210 10*3/uL (ref 150–400)
Platelets: 248 10*3/uL (ref 150–400)
Platelets: 180 10*3/uL (ref 150–400)
RBC: 3.82 MIL/uL — ABNORMAL LOW (ref 4.22–5.81)
RBC: 3.86 MIL/uL — ABNORMAL LOW (ref 4.22–5.81)
RBC: 3.98 MIL/uL — ABNORMAL LOW (ref 4.22–5.81)
RDW: 13.8 % (ref 11.5–15.5)
RDW: 13.9 % (ref 11.5–15.5)
RDW: 14 % (ref 11.5–15.5)
RDW: 14.1 % (ref 11.5–15.5)
RDW: 14.4 % (ref 11.5–15.5)
RDW: 14.6 % (ref 11.5–15.5)
WBC: 8.4 10*3/uL (ref 4.0–10.5)

## 2010-06-01 LAB — DIFFERENTIAL
Basophils Absolute: 0.1 10*3/uL (ref 0.0–0.1)
Basophils Absolute: 0.1 10*3/uL (ref 0.0–0.1)
Basophils Absolute: 0.1 10*3/uL (ref 0.0–0.1)
Basophils Relative: 1 % (ref 0–1)
Eosinophils Relative: 13 % — ABNORMAL HIGH (ref 0–5)
Lymphocytes Relative: 14 % (ref 12–46)
Lymphocytes Relative: 23 % (ref 12–46)
Lymphocytes Relative: 14 % (ref 12–46)
Lymphs Abs: 1 10*3/uL (ref 0.7–4.0)
Lymphs Abs: 2.5 10*3/uL (ref 0.7–4.0)
Monocytes Absolute: 0.5 10*3/uL (ref 0.1–1.0)
Monocytes Absolute: 0.6 10*3/uL (ref 0.1–1.0)
Monocytes Absolute: 0.8 10*3/uL (ref 0.1–1.0)
Monocytes Absolute: 0.5 10*3/uL (ref 0.1–1.0)
Monocytes Relative: 7 % (ref 3–12)
Neutro Abs: 4.2 10*3/uL (ref 1.7–7.7)
Neutro Abs: 4.8 10*3/uL (ref 1.7–7.7)
Neutro Abs: 5.7 10*3/uL (ref 1.7–7.7)
Neutro Abs: 6.8 10*3/uL (ref 1.7–7.7)
Neutrophils Relative %: 46 % (ref 43–77)
Neutrophils Relative %: 65 % (ref 43–77)

## 2010-06-01 LAB — URINALYSIS, ROUTINE W REFLEX MICROSCOPIC
Bilirubin Urine: NEGATIVE
Glucose, UA: NEGATIVE mg/dL
Nitrite: POSITIVE — AB
Specific Gravity, Urine: 1.015 (ref 1.005–1.030)
pH: 7 (ref 5.0–8.0)

## 2010-06-01 LAB — COMPREHENSIVE METABOLIC PANEL
ALT: 28 U/L (ref 0–53)
AST: 26 U/L (ref 0–37)
AST: 29 U/L (ref 0–37)
Albumin: 3.3 g/dL — ABNORMAL LOW (ref 3.5–5.2)
Albumin: 3.6 g/dL (ref 3.5–5.2)
Alkaline Phosphatase: 65 U/L (ref 39–117)
BUN: 20 mg/dL (ref 6–23)
Calcium: 8.8 mg/dL (ref 8.4–10.5)
Chloride: 108 mEq/L (ref 96–112)
Creatinine, Ser: 1.32 mg/dL (ref 0.4–1.5)
Creatinine, Ser: 1.54 mg/dL — ABNORMAL HIGH (ref 0.4–1.5)
GFR calc Af Amer: 52 mL/min — ABNORMAL LOW (ref >60)
GFR calc Af Amer: >60 mL/min (ref >60)
Glucose, Bld: 112 mg/dL — ABNORMAL HIGH (ref 70–99)
Sodium: 139 mEq/L (ref 135–145)
Total Bilirubin: 0.9 mg/dL (ref 0.3–1.2)
Total Protein: 7.2 g/dL (ref 6.0–8.3)

## 2010-06-01 LAB — URINE MICROSCOPIC-ADD ON

## 2010-06-01 LAB — POCT I-STAT, CHEM 8
BUN: 35 mg/dL — ABNORMAL HIGH (ref 6–23)
Calcium, Ion: 1.11 mmol/L — ABNORMAL LOW (ref 1.12–1.32)
Chloride: 108 mEq/L (ref 96–112)

## 2010-06-01 LAB — CULTURE, BLOOD (ROUTINE X 2)
Culture: NO GROWTH
Culture: NO GROWTH
Report Status: 2082011
Report Status: 2082011

## 2010-06-01 LAB — TSH: TSH: 3.442 u[IU]/mL (ref 0.350–4.500)

## 2010-06-01 LAB — LIPID PANEL: Triglycerides: 51 mg/dL (ref <150)

## 2010-06-01 LAB — CARDIAC PANEL(CRET KIN+CKTOT+MB+TROPI)
CK, MB: 3.2 ng/mL (ref 0.3–4.0)
Relative Index: INVALID (ref 0.0–2.5)
Relative Index: INVALID (ref 0.0–2.5)
Total CK: 87 U/L (ref 7–232)
Troponin I: 0.03 ng/mL (ref 0.00–0.06)

## 2010-06-01 LAB — URINE CULTURE: Colony Count: 2000

## 2010-06-01 LAB — TROPONIN I
Troponin I: 0.02 ng/mL (ref 0.00–0.06)
Troponin I: 0.02 ng/mL (ref 0.00–0.06)

## 2010-06-01 LAB — CK TOTAL AND CKMB (NOT AT ARMC)
CK, MB: 2.3 ng/mL (ref 0.3–4.0)
CK, MB: 3 ng/mL (ref 0.3–4.0)
Relative Index: INVALID (ref 0.0–2.5)
Total CK: 90 U/L (ref 7–232)

## 2010-06-01 LAB — BRAIN NATRIURETIC PEPTIDE: Pro B Natriuretic peptide (BNP): 145 pg/mL — ABNORMAL HIGH (ref 0.0–100.0)

## 2010-06-01 LAB — POCT CARDIAC MARKERS: CKMB, poc: <1 ng/mL — ABNORMAL LOW (ref 1.0–8.0)

## 2010-07-26 NOTE — Assessment & Plan Note (Signed)
OFFICE VISIT   LABAN, OROURKE  DOB:  [birth year]                                       07/12/2009  EAVWU#:[id number]   Patient comes back for followup.  I initially saw patient in the office.  He was here to be evaluated for his carotid disease, but he had a  syncopal episode and was sent to the emergency department.  He was seen  by Dr. Darrick Penna as a consult.  He ultimately underwent arteriogram by Dr.  Edilia Bo.  This revealed approximately 50% stenosis on the left side.  He  comes back in today for further discussion.  He has not had any other  events.   I have reiterated to patient over the course of approximately 30 minutes  that I do not feel like his syncopal episodes are coming from his  extracranial carotid disease.  This will need to be followed as the  nights progresses; however, I will not plan on seeing him back for  another year.  I have encouraged him to go back to see his cardiologist  to clarify whether or not there is a cardiac etiology.  If not, they  will be able to provide guidance as to the next step in evaluation of  his syncopal episodes.  I will plan on seeing him back in 1 year.     Jorge Ny, MD  Electronically Signed   VWB/MEDQ  D:  07/12/2009  T:  07/13/2009  Job:  [id number]

## 2010-07-29 NOTE — H&P (Signed)
   NAME:  MART, COLPITTS NO.:  [id number]   MEDICAL RECORD NO.:  [id number]                   PATIENT TYPE:   LOCATION:                                       FACILITY:  [facility]   PHYSICIAN:  Dalia Heading, M.D.               DATE OF BIRTH:  [birth year]   DATE OF ADMISSION:  DATE OF DISCHARGE:                                HISTORY & PHYSICAL   CHIEF COMPLAINT:  Left breast mass.   HISTORY OF PRESENT ILLNESS:  The patient is a 77 year old white male who was  referred for evaluation and treatment of a left breast mass.  It has been  present for approximately three months and is causing nipple retraction.  It  is tender to touch and is directly behind the left nipple.  No nipple  discharge is noted.   PAST MEDICAL HISTORY:  1. Bladder control difficulties.  2. Coronary artery disease.   PAST SURGICAL HISTORY:  1. Back surgery.  2. Bladder surgery for prostate enlargement in 2003.  3. CABG 1995.   CURRENT MEDICATIONS:  1. __________ 10 mg p.o. daily.  2. Doxycycline 100 mg p.o. t.i.d.   ALLERGIES:  No known drug allergies.   REVIEW OF SYSTEMS:  Noncontributory.   PHYSICAL EXAMINATION:  GENERAL:  The patient is a well-developed, well-  nourished white male in no acute distress.  VITAL SIGNS:  He is afebrile and vital signs are stable.  LUNGS:  Clear to auscultation with equal breath sounds bilaterally.  HEART:  Regular rate and rhythm without S3, S4, or murmurs.  BREASTS:  Right breast examination is unremarkable.  Left breast examination  reveals an oval tender mass posterior to the nipple with retraction.  No  nipple discharge is noted.  The axilla is negative for palpable nodes.   LABORATORIES:  Mammogram of the left breast reveals increased density in the  left retroareolar region.   IMPRESSION:  Left breast mass, question unilateral gynecomastia versus  malignancy.    PLAN:  The patient is scheduled for a left breast biopsy on May 16, 2002.  The risks and benefits of the procedure including bleeding and infection  were fully explained to the patient, gave informed consent.                                               Dalia Heading, M.D.    MAJ/MEDQ  D:  05/08/2002  T:  05/08/2002  Job:  [id number]   cc:   Linna Darner, M.D.

## 2010-07-29 NOTE — H&P (Signed)
   NAME:  Brett Baldwin, HARING NO.:  [id number]   MEDICAL RECORD NO.:  [id number]                   PATIENT TYPE:  AMB   LOCATION:  DAY                                  FACILITY:  [facility]   PHYSICIAN:  Ky Barban, M.D.            DATE OF BIRTH:  10/09/1923   DATE OF ADMISSION:  DATE OF DISCHARGE:                                HISTORY & PHYSICAL   CHIEF COMPLAINT:  Symptoms of prostatism.   HISTORY OF PRESENT ILLNESS:  The patient is having a lot of symptoms of  prostatism, slowing of the stream, hesitancy, nocturia, frequency.  He was  recently in the hospital with urinary tract infection, high fever, positive  urine culture.  He was treated with antibiotic.  He continues to have  symptoms.  He had workup done with cystoscopy.  It shows only partial  bladder neck obstruction and his bladder does not empty completely, so I  have advised him to undergo holmium laser ablation of the prostate.  The  procedure risks, complications, limitations are discussed.  He understands  and wants me to proceed.  He is coming as an outpatient to have the  procedure done and will be admitted in the hospital.   PAST MEDICAL HISTORY:  He denies any chest pain.  No history of ever  diabetes or hypertension.   ALLERGIES:  None.   MEDICATIONS:  None.   SOCIAL HISTORY:  He does not smoke or drink.   PHYSICAL EXAMINATION:  VITAL SIGNS:  Blood pressure is 120/80, temperature  is normal.  CENTRAL NERVOUS SYSTEM:  No gross neurologic deficit.  HEENT:  Head and neck are negative.  CHEST:  Symmetrical.  CARDIAC:  Regular sinus rhythm.  ABDOMEN:  Soft, flat.  Liver, spleen, kidneys are not palpable.  No CVA  tenderness.  GENITOURINARY:  External genitalia circumcised male.  __________.  Testicles  are normal.  RECTAL:  Normal sphincter tone.  No rectal mass.  Prostate 1-1/2+, smooth,  and firm.   IMPRESSION:  Benign prostatic hypertrophy, recurrent urinary tract  infection.   PLAN:  Holmium ablation of the prostate under anesthesia, then will be  admitted in the hospital.                                               Ky Barban, M.D.    MIJ/MEDQ  D:  11/28/2001  T:  11/28/2001  Job:  [id number]

## 2010-07-29 NOTE — Op Note (Signed)
   NAME:  Brett Baldwin, Brett Baldwin NO.:  [id number]   MEDICAL RECORD NO.:  [id number]                  PATIENT TYPE:   LOCATION:                                       FACILITY:   PHYSICIAN:  Ky Barban, M.D.            DATE OF BIRTH:   DATE OF PROCEDURE:  DATE OF DISCHARGE:                                 OPERATIVE REPORT   PREOPERATIVE DIAGNOSIS:  Benign prostatic hypertrophy with bladder neck  obstruction.   POSTOPERATIVE DIAGNOSIS:  Benign prostatic hypertrophy with bladder neck  obstruction.   PROCEDURE:  Holmium ablation of the prostate, plus transurethral resection  of the prostate.   SURGEON:  Ky Barban, M.D.   ANESTHESIA:  Spinal   DESCRIPTION OF PROCEDURE:  The patient was given spinal anesthesia placed in  the lithotomy position and prepped and draped.  A #28 Iglesias resectoscope  was introduced into the bladder.  Once again the prostatic urethra is  examined.  The ablation of the prostate is started.  The settings of the  laser were 3.2 joules, 25 Hz, 80 watts, total 63.87 kilojoules, fibersite  firing 550.  The ablation was started on the right side starting at the 11  o'clock position and working towards the 6 o'clock position.  The right lobe  of the prostate was ablated and then shifted to the left side between 1 and  6 o'clock position.  The left lobe was ablated.  There was a small medium  lobe which was ablated before I started the lateral lobes.   At this point the prostatic urethra is open, but there is a lot of whitish,  necrotic tissue hanging around in the prostatic urethra so I decided to use  a regular screw to just scrape that area off.  The resectoscope loop was  introduced and using electrocautery the prostatic urethra was scraped and it  looks wide open.  There was no bleeding going on.  All the chips were  evacuated and resectoscope was removed and a 22, 3-way Foley catheter was  left in for drainage.   The patient left the operating room in satisfactory  condition.                                                Ky Barban, M.D.    MIJ/MEDQ  D:  11/29/2001  T:  12/02/2001  Job:  [id number]   cc:   Rose Phi. Myna Hidalgo, M.D.  [address] [facility]  [place], [place] [address]  Fax: [phone]

## 2010-07-29 NOTE — Cardiovascular Report (Signed)
NAME:  Brett Baldwin, Brett Baldwin NO.:  [id number]   MEDICAL RECORD NO.:  [id number]          PATIENT TYPE:  OIB   LOCATION:  1965                         FACILITY:  [facility]   PHYSICIAN:  Arvilla Meres, M.D. LHCDATE OF BIRTH:  10/10/1923   DATE OF PROCEDURE:  05/03/2005  DATE OF DISCHARGE:  05/03/2005                              CARDIAC CATHETERIZATION   The second and then some dictating cardiac catheterization.   CARDIOLOGIST:  Dr. Andee Lineman.   PATIENT IDENTIFICATION:  Brett Baldwin is an 77 year old male, with  history of known ischemic cardiomyopathy status post CABG at [facility] in 1995, who was recently having some atypical chest pain.  Cardiolite showed an EF of 21% with anterior and anterior apical scar but no  ischemia. He was referred for cardiac catheterization to assess his coronary  anatomy and as part of a workup for a defibrillator. This was performed in  the outpatient CV laboratory.   PROCEDURES PERFORMED:  1.  Selective coronary angiography.  2.  Saphenous vein graft angiography.  3.  LIMA angiography.  4.  Left heart catheterization.  5.  Left ventriculogram done with a hand injection and about 9 cc of dye.   DESCRIPTION OF PROCEDURE:  The risks and benefits of catheterization were  explained to Brett Baldwin; consent was placed on the chart. Given his  renal insufficiency, he was pretreated with a bicarbonate drip prior to the  catheterization and efforts were made to minimize contrast exposure. A 4-  [place] arterial sheath was placed in the right femoral artery using modified  Seldinger technique. A JL-4 catheters was used for left coronary system, JR-  4 was used for the right coronary artery as was the saphenous vein grafts.  We used standard IM catheter exchanged over wire to image the IMA and a  standard bent pigtail was used for the left heart catheterization. All  catheter exchanges made over wire. There were no apparent  complications.   Central aortic pressure is 144/66 with a mean of 96. LV pressure is 132/8  with LVEDP of 12. There is no aortic stenosis.   CORONARY ANATOMY:  Left main was short and mildly calcified. No significant  atherosclerosis.   LAD was heavily calcified in its proximal portion. There was 40-50% tubular  lesion. The LAD then gave off two very tiny diagonals prior to being totally  occluded in the midsection. The diagonals were small and diffusely diseased.   Left circumflex was previously a dominant vessel. It gave off a very small  OM-1. There was also evidence of an OM-2 and OM-3 which were heavily  diseased. The left circumflex was totally occluded in the mid-AV groove.  Prior to the total occlusion, there was a tandem 90% lesions. There was  ostial 95% lesion in the high small OM-1. Evidence of an OM-2 and OM-3 which  were totally occluded.   The RCA was a small, nondominant vessel.   The saphenous vein graft to the PDA which came off the left circumflex was  widely patent. This back filled the PDA into the distal AV groove circumflex  and also revealed posterolateral  or a low marginal. The distal PDA was  heavily diffusely diseased with a focal 80% stenosis. The distal AV groove  circumflex was also heavily and severely calcified.   The saphenous vein graft sequential to the OM-2 and OM-3 was widely patent.  There was a 40% stenosis in the proximal portion of the graft. The OM-2 was  patent; however, there was diffuse distal disease with 95% lesion very  distally. The overall vessel was 1.5 millimeters. The saphenous vein graft  jump to the OM-3 was widely patent. Once again, OM-3 was small with a 70-80%  focal stenosis in it.   The LIMA to the LAD was widely patent. It filled the LAD well and there was  some moderate disease in the LAD but no obstructive coronary disease. There  was evidence of a distal diagonal branch as well.   Left ventriculogram done the RAO  position using a hand injection to minimize  dye showed an EF of about 35% with inferior akinesis.   ASSESSMENT:  1.  Severe three-vessel native coronary disease.  2.  All three of his bypass grafts were open with severe diffuse distal      disease in the native vessels as well as branch vessel disease.  3.  Left ventricular ejection fraction appears to be 35% by hand injection      of the left ventricular. The left ventricular filling pressure is      normal.   PLAN:  We will continue with medical therapy as per Dr. Andee Lineman. I would  consider obtaining echocardiogram to further evaluate the ejection fraction.      Arvilla Meres, M.D. [facility]  Electronically Signed     DB/MEDQ  D:  05/03/2005  T:  05/03/2005  Job:  [id number]   cc:   Learta Codding, M.D. [facility]  [address]  [address]  [place]  [place] [address]

## 2010-07-29 NOTE — Op Note (Signed)
   NAME:  Brett Baldwin, Brett Baldwin NO.:  [id number]   MEDICAL RECORD NO.:  [id number]                   PATIENT TYPE:  AMB   LOCATION:  DAY                                  FACILITY:  [facility]   PHYSICIAN:  Dalia Heading, M.D.               DATE OF BIRTH:  11-01-1923   DATE OF PROCEDURE:  05/16/2002  DATE OF DISCHARGE:                                 OPERATIVE REPORT   PREOPERATIVE DIAGNOSIS:  Left breast mass.   POSTOPERATIVE DIAGNOSIS:  Left breast mass.   PROCEDURE:  Left breast biopsy.   SURGEON:  Dalia Heading, M.D.   ANESTHESIA:  MAC.   COMPLICATIONS:  None.   SPECIMENS:  Left breast mass.   ESTIMATED BLOOD LOSS:  Minimal.   INDICATIONS:  The patient is a 77 year old white male who was referred for  evaluation and treatment of a dominant mass behind the nipple in the left  breast.  The risks and benefits of the procedure, including bleeding and  infection, were fully explained to the patient, who gave informed consent.   DESCRIPTION OF PROCEDURE:  The patient was placed in the supine position.  The left breast was prepped and draped using the usual sterile technique  with Betadine.  A combination of 2% Sensorcaine and 1% Xylocaine was used  for local anesthesia.  Surgical site confirmation was performed.   An infra-areolar incision was made.  The mass behind the left nipple was  removed and sent to pathology for further examination.  Any bleeding was  controlled using Bovie electrocautery.  The skin was closed using a 4-0  Vicryl subcuticular suture.  Steri-Strips and a dry sterile dressing were  applied.   All tape and needle counts were correct at the end of the procedure.  The  patient was transferred to day surgery in stable condition.                                              Dalia Heading, M.D.   MAJ/MEDQ  D:  05/16/2002  T:  05/17/2002  Job:  [id number]   cc:   Dr. Juventino Slovak, [place]

## 2010-07-29 NOTE — Discharge Summary (Signed)
   NAME:  Brett Baldwin, Brett Baldwin NO.:  [id number]   MEDICAL RECORD NO.:  [id number]                   PATIENT TYPE:  INP   LOCATION:  A212                                 FACILITY:  [facility]   PHYSICIAN:  Ky Barban, M.D.            DATE OF BIRTH:  1923-12-10   DATE OF ADMISSION:  11/29/2001  DATE OF DISCHARGE:  12/01/2001                                 DISCHARGE SUMMARY   HISTORY OF PRESENT ILLNESS:  A 77 year old gentleman who has longstanding  history of prostatism recently was in the hospital with urinary tract  infection, high fever, positive urine culture, treated with antibiotics.  Cystoscopy in the office shows that he has partial bladder neck obstruction  and bladder does not empty completely.  Initially he was treated with Flomax  but continued to have these infections and symptoms so I advised him to  undergo holmium laser ablation.  The patient agreed so he was brought as an  outpatient.   HOSPITAL COURSE:  Routine admission workup of CBC, urinalysis, and Astra-7  were normal.  He was taken to the operating room and holmium laser ablation  of the prostate was done and also I had to do a TUR of prostate to remove  some of his prostatic tissue.  Postoperatively he did fine.  He remained  afebrile.  His urine is clear.  His laboratory workup after the operation  showed sodium 139, potassium 3.7, chloride 105, CO2 28, glucose 118, BUN 15,  creatinine 1.  CBC showed wbc count 7.1, hematocrit 32.5.  Final pathology  report shows BPH and chronic prostatitis.  He remained afebrile, urine is  clear.  On postoperative day #2 the catheter was taken out; he is voiding  fine with a good stream and good control.  He is being discharged.  He will  be followed up by me in the office.   FINAL DISCHARGE DIAGNOSIS:  Benign prostatic hypertrophy.   DISCHARGE MEDICATIONS:  1. Cipro 500 mg one p.o. daily #7.  2. Percocet one p.o. q.6h. p.r.n.   FOLLOW-UP:   Report to the office in two weeks.                                                  Ky Barban, M.D.    MIJ/MEDQ  D:  01/08/2002  T:  01/09/2002  Job:  [id number]

## 2010-09-13 ENCOUNTER — Encounter: Payer: Self-pay | Admitting: Cardiology

## 2011-08-09 ENCOUNTER — Emergency Department (HOSPITAL_COMMUNITY): Payer: Medicare Other

## 2011-08-09 ENCOUNTER — Encounter (HOSPITAL_COMMUNITY): Payer: Self-pay | Admitting: Neurology

## 2011-08-09 ENCOUNTER — Emergency Department (HOSPITAL_COMMUNITY)
Admission: EM | Admit: 2011-08-09 | Discharge: 2011-08-09 | Disposition: A | Discharge status: Home / Self Care | Payer: Medicare Other | Attending: Emergency Medicine | Admitting: Emergency Medicine

## 2011-08-09 DIAGNOSIS — R059 Cough, unspecified: Secondary | ICD-10-CM | POA: Insufficient documentation

## 2011-08-09 DIAGNOSIS — Z7982 Long term (current) use of aspirin: Secondary | ICD-10-CM | POA: Insufficient documentation

## 2011-08-09 DIAGNOSIS — J449 Chronic obstructive pulmonary disease, unspecified: Secondary | ICD-10-CM | POA: Insufficient documentation

## 2011-08-09 DIAGNOSIS — R05 Cough: Secondary | ICD-10-CM | POA: Insufficient documentation

## 2011-08-09 DIAGNOSIS — J4489 Other specified chronic obstructive pulmonary disease: Secondary | ICD-10-CM | POA: Insufficient documentation

## 2011-08-09 DIAGNOSIS — R0602 Shortness of breath: Secondary | ICD-10-CM | POA: Insufficient documentation

## 2011-08-09 DIAGNOSIS — R0601 Orthopnea: Secondary | ICD-10-CM | POA: Insufficient documentation

## 2011-08-09 DIAGNOSIS — I509 Heart failure, unspecified: Secondary | ICD-10-CM | POA: Insufficient documentation

## 2011-08-09 DIAGNOSIS — I2510 Atherosclerotic heart disease of native coronary artery without angina pectoris: Secondary | ICD-10-CM | POA: Insufficient documentation

## 2011-08-09 DIAGNOSIS — Z79899 Other long term (current) drug therapy: Secondary | ICD-10-CM | POA: Insufficient documentation

## 2011-08-09 DIAGNOSIS — R079 Chest pain, unspecified: Secondary | ICD-10-CM | POA: Insufficient documentation

## 2011-08-09 DIAGNOSIS — Z86718 Personal history of other venous thrombosis and embolism: Secondary | ICD-10-CM | POA: Insufficient documentation

## 2011-08-09 DIAGNOSIS — I10 Essential (primary) hypertension: Secondary | ICD-10-CM | POA: Insufficient documentation

## 2011-08-09 DIAGNOSIS — R0989 Other specified symptoms and signs involving the circulatory and respiratory systems: Secondary | ICD-10-CM | POA: Insufficient documentation

## 2011-08-09 LAB — DIFFERENTIAL
Basophils Absolute: 0 10*3/uL (ref 0.0–0.1)
Eosinophils Absolute: 0.1 10*3/uL (ref 0.0–0.7)
Lymphs Abs: 1.2 10*3/uL (ref 0.7–4.0)
Monocytes Absolute: 0.7 10*3/uL (ref 0.1–1.0)
Monocytes Relative: 9 % (ref 3–12)
Neutro Abs: 6.2 10*3/uL (ref 1.7–7.7)

## 2011-08-09 LAB — CBC
HCT: 36.4 % — ABNORMAL LOW (ref 39.0–52.0)
Hemoglobin: 12.1 g/dL — ABNORMAL LOW (ref 13.0–17.0)
MCH: 31.2 pg (ref 26.0–34.0)
MCHC: 33.2 g/dL (ref 30.0–36.0)
RBC: 3.88 MIL/uL — ABNORMAL LOW (ref 4.22–5.81)

## 2011-08-09 LAB — BASIC METABOLIC PANEL
CO2: 23 mEq/L (ref 19–32)
Calcium: 8.6 mg/dL (ref 8.4–10.5)
GFR calc non Af Amer: 47 mL/min — ABNORMAL LOW (ref >90)
Sodium: 140 mEq/L (ref 135–145)

## 2011-08-09 LAB — POCT I-STAT TROPONIN I: Troponin i, poc: 0.05 ng/mL (ref 0.00–0.08)

## 2011-08-09 MED ORDER — FUROSEMIDE 20 MG PO TABS: 20.0000 mg | ORAL_TABLET | Freq: Every day | ORAL | Status: DC | PRN

## 2011-08-09 MED ORDER — FUROSEMIDE 10 MG/ML IJ SOLN
40.0000 mg | Freq: Once | INTRAMUSCULAR | Status: AC
  Administered 2011-08-09: 40 mg via INTRAVENOUS
  Filled 2011-08-09: qty 4

## 2011-08-09 MED ORDER — SODIUM CHLORIDE 0.9 % IV SOLN: INTRAVENOUS | Status: DC

## 2011-08-09 MED ORDER — NAPROXEN 500 MG PO TABS
500.0000 mg | ORAL_TABLET | Freq: Once | ORAL | Status: AC
  Administered 2011-08-09: 500 mg via ORAL
  Filled 2011-08-09: qty 1

## 2011-08-09 NOTE — ED Notes (Signed)
Discharged home with written and verbal instructions.  No questions or concerns at discharge.  Left ambulatory, steady gait.

## 2011-08-09 NOTE — Discharge Instructions (Signed)
As we discussed, you should call the heart failure clinic tomorrow to set up a follow-up appointment.   Take the Lasix only as needed for shortness of breath and fluid retention. If you do not have relief with 1 dose, you should return to the emergency department for further care if you have not been able to get into the clinic yet.   Heart Failure Heart failure happens when the heart muscle does not work normally. This means your heart does not pump blood efficiently for your body to work well. This may cause blood to back up into the lungs or cause your lower legs to swell. Heart failure is a long-term (chronic) condition. It is important for you to take good care of yourself and follow your caregiver's treatment plan.  SYMPTOMS   Shortness of breath with mild exercise or while at rest.   A persistent cough.   Abnormal swelling in the feet and legs.   Unexplained sudden weight gain.   Fatigue and loss of energy.   Feeling lightheaded or close to fainting.   Chest or abdominal pain.  CAUSES   Coronary artery disease.   High blood pressure.   Heart attacks.   Abnormal heart valves.   Lung disease.   Diabetes.  MEDICAL EVALUATION MAY REQUIRE:  Electrocardiogram (EKG).   Chest X-ray.   Blood tests.   Exercise stress test.   Echocardiogram.   Angiogram.   Radionuclide ventriculography.  The symptoms of heart failure can improve with proper treatment. Medications, lifestyle changes, or surgical intervention may be necessary to treat heart failure. Heart medications prescribed include those that lower blood pressure, strengthen contractility, and improve circulation. A diuretic or "water pill" may also be prescribed so excess fluid can be removed from the body. Surgical intervention may include procedures that open blocked arteries or repair damaged heart valves. In some cases, a pacemaker is need to help the heart pump better. Lifestyle changes include quitting smoking,  eating a healthy diet, limiting salt intake and limiting alcoholic drinks. Exercise as told by your caregiver.  Salt increases fluid retention and should be avoided. Stay away from food made with a lot of salt. For example, stay away from chips, pretzels, pickles, olives, and canned foods. Eat a 1500 milligram salt (sodium) diet or as told by your doctor. Weigh yourself each morning after you urinate but before you eat breakfast. Keep a record of your weight to show your caregiver. A sudden weight gain can mean fluid buildup. Tell your caregiver if you gain 3 or more pounds (1.4 kilograms) in a day or 5 pounds (2.3 kilograms) in a week.  Make a list of every medicine, vitamin, or herbal supplement you are taking. Keep the list with you at all times. Show it to your caregiver at every visit. Keep the list up-to-date. Ask your caregiver or pharmacist to write an explanation of each medicine you are taking. SEEK IMMEDIATE MEDICAL CARE IF:  You develop increased breathing, shortness of breath, severe cough or are coughing up blood.   You have severe chest or abdominal pain, or unusual heart palpitations.   You develop weakness or numbness in your face or on one side of the body.  Document Released: 04/06/2004 Document Revised: 02/16/2011 Document Reviewed: 10/10/2007 [facility] Patient Information 2012 [place], [place].

## 2011-08-09 NOTE — ED Notes (Signed)
Low sodium diet ordered  

## 2011-08-09 NOTE — ED Provider Notes (Signed)
Pt to CDU on CHF protocol. Hx CAD, several days progressive DOE, orthopnea, cough. Bilateral rales on initial examination with CXR evidence of mild CHF. Labs with stable anemia, stable renal insufficiency, negative troponin, no leukocytosis.    In CDU, pt has had a good amount of diuresis from initial Lasix dose, reports sig subjective symptom improvement. Lungs are now clear to auscultation and he displays no increased WOB. Heart RRR. Abd s/nt. LE without edema. Speech clear.  Pt and family are comfortable with d.c home at this time. Has never seen a physician for CHF. Has not seen cardiologist in some time. They will be given resources to call [facility] as I was unable to contact before office closed for the day. I will give a Lasix Rx that he is instructed to use only if SOB develops and only until he sees MD in AHF clinic. Advised to return to ED if symptoms not relieved with a single dose of Lasix or if other concerns arise. Pt and family voice understanding.   Shaaron Adler, [place] 08/09/11 1845

## 2011-08-09 NOTE — ED Notes (Addendum)
Pt reporting sob x 1 month, sob worse over past few days. Pt reporting hasn't been able to lie down, been sitting up right. Reporting substernal cp 3/10, worse when lying down and with deep inspiration. Skin warm and dry. Denying any n/v. Pt c/o cough, non-productive. Vitals stable. Pt wife at bedside

## 2011-08-09 NOTE — ED Notes (Signed)
Meal tray provided to patient.  Family remains in room at bedside.  ED Monitors remain.

## 2011-08-09 NOTE — ED Notes (Addendum)
Ambulated PT with pulse ox. O2 did not drop below 96% room air

## 2011-08-09 NOTE — ED Provider Notes (Signed)
History     CSN: [id number]  Arrival date & time 08/09/11  1224   First MD Initiated Contact with Patient 08/09/11 1254      Chief Complaint  Patient presents with  . Shortness of Breath  . Chest Pain    (Consider location/radiation/quality/duration/timing/severity/associated sxs/prior treatment) The history is provided by the patient and the spouse.   the patient is an 78 year old, male, with a history of coronary artery disease, who presents to emergency department complaining of orthopnea, cough, with yellow sputum, and intermittent chest pain.  He denies nausea, vomiting, fevers, chills, sweating, leg pain or swelling.  His chest.  Pain is intermittent and substernal.  It only lasts for a few minutes.  He denies any chest pain.  At this time.  He denies smoking, and does not use oxygen at home except at night.  Past Medical History  Diagnosis Date  . Hypertension   . History of pulmonary embolism     No pulmonary embolus on the current CT scan as of 03/01/2009  . Carotid artery stenosis     s/p CT of head and neck. Right common carotid artery and right internal carotid artery were occluded. No contiguos string sign was detected. Prominent irregular calcified plaque noted int eh left carotid artery with an estimated 60% diameter stenosis. Also 75% stenosis in the proximal left vertebral artery  . COPD (chronic obstructive pulmonary disease)   . Pulmonary nodules   . Swallowing difficulty   . CAD (coronary artery disease)     Past Surgical History  Procedure Date  . Cardiac catheterization 2007    revealing patent grafts  . Coronary artery bypass graft 1995  . Prostate surgery     Hx of bypass prostate infections    Family History  Problem Relation Age of Onset  . Heart disease Neg Hx     History  Substance Use Topics  . Smoking status: Not on file  . Smokeless tobacco: Not on file  . Alcohol Use: No      Review of Systems  Constitutional: Negative for fever,  chills and diaphoresis.  Respiratory: Positive for cough and shortness of breath. Negative for chest tightness.   Cardiovascular: Positive for chest pain. Negative for palpitations and leg swelling.       Orthopnea  Gastrointestinal: Negative for nausea and vomiting.  Neurological: Negative for weakness and headaches.  Psychiatric/Behavioral: Negative for confusion.  All other systems reviewed and are negative.    Allergies  Review of patient's allergies indicates no known allergies.  Home Medications   Current Outpatient Rx  Name Route Sig Dispense Refill  . ALBUTEROL SULFATE HFA 108 (90 BASE) MCG/ACT IN AERS Inhalation Inhale 2 puffs into the lungs every 6 (six) hours as needed. For shortness of breath    . ASPIRIN 325 MG PO TABS Oral Take 325 mg by mouth daily.    Marland Kitchen BISOPROLOL-HYDROCHLOROTHIAZIDE 2.5-6.25 MG PO TABS Oral Take 1 tablet by mouth daily.    Marland Kitchen MUCINEX PO Oral Take 1 tablet by mouth 2 (two) times daily as needed. For cough    . METHYLPREDNISOLONE (PAK) PO Oral Take 1-6 tablets by mouth daily. Starting 08/04/11 take 6 tabs day 1, 5 tabs day 2, 4 tabs day 3, 3 tabs day 4, 2 tabs day 5, 1 tab day 6    . SIMVASTATIN 80 MG PO TABS Oral Take 80 mg by mouth at bedtime.        BP 131/72  Pulse 88  Temp(Src) 97.8 F (36.6 C) (Oral)  Resp 20  SpO2 96%  Physical Exam  Nursing note and vitals reviewed. Constitutional: He is oriented to person, place, and time. He appears well-developed and well-nourished. No distress.  HENT:  Head: Normocephalic and atraumatic.  Eyes: Conjunctivae are normal.  Neck: Normal range of motion. Neck supple.  Cardiovascular: Normal rate.   No murmur heard. Pulmonary/Chest: Effort normal. He has rales.       Bilateral rales  Abdominal: Soft. Bowel sounds are normal. There is no tenderness.  Musculoskeletal: Normal range of motion. He exhibits no edema and no tenderness.  Neurological: He is alert and oriented to person, place, and time.    Skin: Skin is warm and dry.  Psychiatric: He has a normal mood and affect. Thought content normal.    ED Course  Procedures (including critical care time) 78 year old, male, with history of coronary artery disease, presents with symptoms consistent with congestive heart failure.  We'll do a chest x-ray, and laboratory testing.  He has intermittent chest pain, but his symptoms have been present for the past 5 days.  I doubt that he said an acute coronary event, but we will get a troponin for confirmation.  Labs Reviewed  CBC - Abnormal; Notable for the following:    RBC 3.88 (*)    Hemoglobin 12.1 (*)    HCT 36.4 (*)    All other components within normal limits  BASIC METABOLIC PANEL - Abnormal; Notable for the following:    Glucose, Bld 113 (*)    BUN 27 (*)    GFR calc non Af Amer 47 (*)    GFR calc Af Amer 54 (*)    All other components within normal limits  DIFFERENTIAL   Dg Chest Port 1 View  08/09/2011  *RADIOLOGY REPORT*  Clinical Data: Shortness of breath and cough.  PORTABLE CHEST - 1 VIEW  Comparison: 08/06/2011.  Findings: Trachea is midline.  Heart is mildly enlarged.  Mild interstitial prominence and indistinctness bilaterally with small bilateral pleural effusions.  IMPRESSION: Mild congestive heart failure.  Original Report Authenticated By: Reyes Ivan, M.D.     No diagnosis found.  Patient was able to ambulate in the emergency department and a sat duration on room air, only went down to 95-96%.  Therefore, we will move him to the CDU and give him IV Lasix to control his symptoms.  Better.  MDM  Congestive heart failure        Cheri Guppy, MD 08/09/11 [phone]

## 2011-08-09 NOTE — ED Notes (Addendum)
Per ems-Pt on way to doctor for SOB and CP, pt and wife pulled over at a [employer] and called EMS due to pain worsening. Pt has been having trouble breathing, sob x 1 month worse over 2-3 days. Pt reporting substernal cp, given 324 aspirin, 1 nitro, CP relieved, currently 3/10 at this time. Reporting non-productive dry cough. EKG showing left bundle branch block. 128 systolic, HR 86. A x 4. 96% RA

## 2011-08-10 ENCOUNTER — Telehealth (HOSPITAL_COMMUNITY): Payer: Self-pay | Admitting: *Deleted

## 2011-08-10 NOTE — Telephone Encounter (Signed)
Dawn, can you call and schedule him for Mon at 2:45 I have no where else to put him and he has to be seen with in a week, he is a new pt, thanks

## 2011-08-10 NOTE — Telephone Encounter (Signed)
Mrs Graumann called today in regards to her husband.  He was seen in the ED last night and was told to call our office to set up an appointment. I would like for you to review his chart and let me know if he should be scheduled here.  Thanks.

## 2011-08-10 NOTE — ED Provider Notes (Signed)
Medical screening examination/treatment/procedure(s) were conducted as a shared visit with non-physician practitioner(s) and myself.  I personally evaluated the patient during the encounter  Cheri Guppy, MD 08/10/11 [phone]

## 2011-08-14 ENCOUNTER — Ambulatory Visit (HOSPITAL_COMMUNITY)
Admission: RE | Admit: 2011-08-14 | Discharge: 2011-08-14 | Disposition: A | Discharge status: Home / Self Care | Payer: Medicare Other | Source: Ambulatory Visit | Attending: Internal Medicine | Admitting: Internal Medicine

## 2011-08-14 ENCOUNTER — Encounter (HOSPITAL_COMMUNITY): Payer: Self-pay

## 2011-08-14 VITALS — BP 90/70 | HR 87 | Ht 67.0 in | Wt 140.1 lb

## 2011-08-14 DIAGNOSIS — J449 Chronic obstructive pulmonary disease, unspecified: Secondary | ICD-10-CM | POA: Insufficient documentation

## 2011-08-14 DIAGNOSIS — Z87440 Personal history of urinary (tract) infections: Secondary | ICD-10-CM | POA: Insufficient documentation

## 2011-08-14 DIAGNOSIS — I2510 Atherosclerotic heart disease of native coronary artery without angina pectoris: Secondary | ICD-10-CM

## 2011-08-14 DIAGNOSIS — R1310 Dysphagia, unspecified: Secondary | ICD-10-CM | POA: Insufficient documentation

## 2011-08-14 DIAGNOSIS — R911 Solitary pulmonary nodule: Secondary | ICD-10-CM | POA: Insufficient documentation

## 2011-08-14 DIAGNOSIS — I5032 Chronic diastolic (congestive) heart failure: Secondary | ICD-10-CM

## 2011-08-14 DIAGNOSIS — I10 Essential (primary) hypertension: Secondary | ICD-10-CM | POA: Insufficient documentation

## 2011-08-14 DIAGNOSIS — E785 Hyperlipidemia, unspecified: Secondary | ICD-10-CM | POA: Insufficient documentation

## 2011-08-14 DIAGNOSIS — I5042 Chronic combined systolic (congestive) and diastolic (congestive) heart failure: Secondary | ICD-10-CM

## 2011-08-14 DIAGNOSIS — J4489 Other specified chronic obstructive pulmonary disease: Secondary | ICD-10-CM | POA: Insufficient documentation

## 2011-08-14 DIAGNOSIS — Z86718 Personal history of other venous thrombosis and embolism: Secondary | ICD-10-CM | POA: Insufficient documentation

## 2011-08-14 DIAGNOSIS — Z7982 Long term (current) use of aspirin: Secondary | ICD-10-CM | POA: Insufficient documentation

## 2011-08-14 DIAGNOSIS — Z87891 Personal history of nicotine dependence: Secondary | ICD-10-CM | POA: Insufficient documentation

## 2011-08-14 DIAGNOSIS — Z951 Presence of aortocoronary bypass graft: Secondary | ICD-10-CM | POA: Insufficient documentation

## 2011-08-14 DIAGNOSIS — I6529 Occlusion and stenosis of unspecified carotid artery: Secondary | ICD-10-CM | POA: Insufficient documentation

## 2011-08-14 MED ORDER — FUROSEMIDE 20 MG PO TABS: 20.0000 mg | ORAL_TABLET | Freq: Every day | ORAL | Status: DC

## 2011-08-14 MED ORDER — POTASSIUM CHLORIDE CRYS ER 20 MEQ PO TBCR: 20.0000 meq | EXTENDED_RELEASE_TABLET | Freq: Every day | ORAL | Status: DC

## 2011-08-14 NOTE — Patient Instructions (Addendum)
[name],  Thank you for coming in today. Please do the following: 1. Weigh daily: keep weight 138-142 lbs, for weight above 142 take an extra lasix. For weight below 138 no lasix event if it is Monday, Wednesday or Friday.  2. Take lasix 20 mg every Monday and Friday 3. Take potassium tablet 20 mg every Monday and Friday 4. Get another ECHO and some PFTs (breathing test) in [place].   Follow up with Dr. Andee Lineman in 3 weeks.

## 2011-08-14 NOTE — Progress Notes (Signed)
Patient ID: Brett Baldwin, male   DOB: Jul 11, 1923, 78 y.o.   MRN: [id number]  PCP: DR. VYAS  HPI:  Brett Baldwin is an 78 y/o male with a h/o CAD s/p CABG '95 at [facility], HTN, HL, recurrent UTIs and carotid stenosis. Previously seen by Dr. Andee Lineman but has not seen him since 2011. Referred from ER for post ER follow-up.  Previously worked in a [employer]. + h/o tobacco quit 30 years ago  Has a h/o recurrent syncope. He had normal cardiac monitor in 2011. Echo EF 50-55%.Cardiolite indicating an old lateral scar but no ischemia with an ejection fraction of 45%. Carotids 04/2009 RICA 100% LICA 50-70% managed medically. No syncope in over 2 years.  Seen in ER 08/09/2011 for progressive dyspnea. Found to have mild CHF on CXR. No BNP drawn. Cr 1.33. Trop normal.  Given lasix 40 mg IV with improvement. Given prescription for lasix 20mg  as needed. Has only taken once since that time. Over past 48 hours more dyspneic. Wife gave him a dose of lasix and had good diuresis. Breathing better but not back to baseline.   + mild edema. No CP/pressure. Short of breath with any exertion (this is baseline for him). Does not weigh himself.      Review of Systems:     Cardiac Review of Systems: {Y] = yes [ ]  = no  Chest Pain [    ]  Resting SOB [   ] Exertional SOB  [ y ]  Orthopnea [  ]   Pedal Edema [   ]    Palpitations [  ] Syncope  [  ]   Presyncope [   ]  General Review of Systems: [Y] = yes [  ]=no Constitional: recent weight change [ y ]; anorexia [  ]; fatigue [  y]; nausea [  ]; night sweats [  ]; fever [  ]; or chills [  ];                                                                                                                                          Dental: poor dentition[ y ];   Eye : blurred vision [  ]; diplopia [   ]; vision changes [  ];  Amaurosis fugax[  ]; Resp: cough [  ];  wheezing[  ];  hemoptysis[  ]; shortness of breath[  ]; paroxysmal nocturnal dyspnea[  ]; dyspnea on  exertion[y  ]; or orthopnea[  ];  GI:  gallstones[  ], vomiting[  ];  dysphagia[  ]; melena[  ];  hematochezia [  ]; heartburn[  ];   Hx of  Colonoscopy[  ]; GU: kidney stones [  ]; hematuria[  ];   dysuria [  ];  nocturia[  ];  history of     obstruction [  ];  Skin: rash, swelling[  ];, hair loss[  ];  peripheral edema[  ];  or itching[  ]; Musculosketetal: myalgias[  ];  joint swelling[  ];  joint erythema[  ];  joint pain[ y ];  back pain[  ];  Heme/Lymph: bruising[  ];  bleeding[  ];  anemia[  ];  Neuro: TIA[  ];  headaches[  ];  stroke[  ];  vertigo[  ];  seizures[  ];   paresthesias[  ];  difficulty walking[ y ];  Psych:depression[  ]; anxiety[  ];  Endocrine: diabetes[  ];  thyroid dysfunction[  ];  Other:    Past Medical History  Diagnosis Date  . Hypertension   . History of pulmonary embolism     No pulmonary embolus on the current CT scan as of 03/01/2009  . Carotid artery stenosis     s/p CT of head and neck. Right common carotid artery and right internal carotid artery were occluded. No contiguos string sign was detected. Prominent irregular calcified plaque noted int eh left carotid artery with an estimated 60% diameter stenosis. Also 75% stenosis in the proximal left vertebral artery  . COPD (chronic obstructive pulmonary disease)   . Pulmonary nodules   . Swallowing difficulty   . CAD (coronary artery disease)     Current Outpatient Prescriptions  Medication Sig Dispense Refill  . albuterol (PROVENTIL HFA;VENTOLIN HFA) 108 (90 BASE) MCG/ACT inhaler Inhale 2 puffs into the lungs every 6 (six) hours as needed. For shortness of breath      . aspirin 325 MG tablet Take 325 mg by mouth daily.      . bisoprolol-hydrochlorothiazide (ZIAC) 2.5-6.25 MG per tablet Take 1 tablet by mouth daily.      . furosemide (LASIX) 20 MG tablet Take 1 tablet (20 mg total) by mouth daily as needed (for shortness of breath/fluid retention).  15 tablet  0  . GuaiFENesin (MUCINEX  PO) Take 1 tablet by mouth 2 (two) times daily as needed. For cough      . METHYLPREDNISOLONE, PAK, PO Take 1-6 tablets by mouth daily. Starting 08/04/11 take 6 tabs day 1, 5 tabs day 2, 4 tabs day 3, 3 tabs day 4, 2 tabs day 5, 1 tab day 6      . simvastatin (ZOCOR) 80 MG tablet Take 80 mg by mouth at bedtime.           No Known Allergies  History   Social History  . Marital Status: Married    Spouse Name: N/A    Number of Children: N/A  . Years of Education: N/A   Occupational History  . Not on file.   Social History Main Topics  . Smoking status: Former Smoker    Quit date: 08/13/1976  . Smokeless tobacco: Not on file  . Alcohol Use: No  . Drug Use: No  . Sexually Active: Not on file   Other Topics Concern  . Not on file   Social History Narrative   Married    Family History  Problem Relation Age of Onset  . Heart disease Neg Hx     PHYSICAL EXAM: Filed Vitals:   08/14/11 1518  BP: 90/70  Pulse: 87   General:  Well appearing. No respiratory difficulty HEENT: normal Neck: supple. no JVD. Carotids 2+ bilat; no bruits. No lymphadenopathy or thryomegaly appreciated. Cor: PMI nondisplaced. Regular rate & rhythm. No rubs, gallops or murmurs. Lungs: clear Abdomen: soft, nontender, nondistended. No hepatosplenomegaly. No bruits or  masses. Good bowel sounds. Extremities: no cyanosis, clubbing, rash, edema Neuro: alert & oriented x 3, cranial nerves grossly intact. moves all 4 extremities w/o difficulty. Affect pleasant.  ECG: 08/09/11 NSR 95. LVH. IVCD TWI v5 & v6 (new since 2011)      ASSESSMENT & PLAN:

## 2011-08-17 DIAGNOSIS — I5023 Acute on chronic systolic (congestive) heart failure: Secondary | ICD-10-CM

## 2011-08-17 LAB — PULMONARY FUNCTION TEST

## 2011-08-19 DIAGNOSIS — I5032 Chronic diastolic (congestive) heart failure: Secondary | ICD-10-CM | POA: Insufficient documentation

## 2011-08-19 DIAGNOSIS — I2510 Atherosclerotic heart disease of native coronary artery without angina pectoris: Secondary | ICD-10-CM | POA: Insufficient documentation

## 2011-08-19 NOTE — Assessment & Plan Note (Signed)
Brett Baldwin is here for post-ER follow-up for diastolic HF. I discussed the mechanisms of this at length with him and his family. Volume status looks pretty good now but does seem to need a diuretic being careful not to overdiurese him with his h/o orthostasis. Reinforced need for daily weights and reviewed use of sliding scale diuretics. At this point will have him:  1. Weigh daily: keep weight 138-142 lbs, for weight above 142 take an extra lasix. For weight below 138 no lasix event if it is Monday, Wednesday or Friday.  2. Take lasix 20 mg every Monday and Friday 3. Take potassium tablet 20 mg every Monday and Friday 4. Get another ECHO and some PFTs (breathing test) at [facility].   Follow up with Dr. Andee Lineman in 3 weeks.

## 2011-08-19 NOTE — Assessment & Plan Note (Signed)
No evidence of ischemia. Continue current regimen.   

## 2011-08-23 ENCOUNTER — Ambulatory Visit: Payer: Medicare Other | Admitting: Cardiology

## 2011-08-24 ENCOUNTER — Encounter: Payer: Self-pay | Admitting: Internal Medicine

## 2011-08-29 ENCOUNTER — Other Ambulatory Visit (HOSPITAL_COMMUNITY): Payer: Self-pay | Admitting: Internal Medicine

## 2011-08-29 DIAGNOSIS — I5042 Chronic combined systolic (congestive) and diastolic (congestive) heart failure: Secondary | ICD-10-CM

## 2011-08-30 ENCOUNTER — Ambulatory Visit (INDEPENDENT_AMBULATORY_CARE_PROVIDER_SITE_OTHER): Payer: Medicare Other | Admitting: Cardiology

## 2011-08-30 ENCOUNTER — Encounter: Payer: Self-pay | Admitting: Cardiology

## 2011-08-30 ENCOUNTER — Telehealth: Payer: Self-pay | Admitting: Cardiology

## 2011-08-30 VITALS — BP 117/59 | HR 89 | Ht 67.0 in | Wt 140.0 lb

## 2011-08-30 DIAGNOSIS — Z951 Presence of aortocoronary bypass graft: Secondary | ICD-10-CM

## 2011-08-30 DIAGNOSIS — I10 Essential (primary) hypertension: Secondary | ICD-10-CM

## 2011-08-30 DIAGNOSIS — I739 Peripheral vascular disease, unspecified: Secondary | ICD-10-CM

## 2011-08-30 DIAGNOSIS — R9430 Abnormal result of cardiovascular function study, unspecified: Secondary | ICD-10-CM

## 2011-08-30 DIAGNOSIS — I951 Orthostatic hypotension: Secondary | ICD-10-CM

## 2011-08-30 DIAGNOSIS — I6529 Occlusion and stenosis of unspecified carotid artery: Secondary | ICD-10-CM

## 2011-08-30 NOTE — Assessment & Plan Note (Signed)
He has known severe LV dysfunction. He reports no chest pain. In 2007 autograft were patent but had severe diffuse distal disease. He now has worsened ejection fraction with an EF of less than 20%. Left ventricular diastolic filling pattern was consistent with restrictive filling pattern. There is moderate mitral regurgitation and moderate tricuspid regurgitation as well as pulmonary hypertension. The patient is currently in  low output state with episodes of recurrent congestive heart failure. The patient will be electively admitted tomorrow [facility] hospital for intravenous milrinone as well as adjustment of heart failure medications and additional diuresis. I told the patient to take an extra Lasix of 20 mg today.

## 2011-08-30 NOTE — Telephone Encounter (Signed)
Admit tomorrow for IV Milrinone to [facility]  Checking percert

## 2011-08-30 NOTE — Assessment & Plan Note (Signed)
Blood pressure well controlled

## 2011-08-30 NOTE — Progress Notes (Signed)
Brett Bottoms, MD, [facility] ABIM Board Certified in Adult Cardiovascular Medicine,Internal Medicine and Critical Care Medicine    CC: Followup patient from the heart failure clinic labeled as  diastolic heart failure  HPI:  The patient is a 78 year old male with history of coronary artery disease status post cardiac catheterization 2007 showed patent grafts but severe distal vessel coronary disease with progressive LV systolic dysfunction. The patient ejection fraction 2007 was 21%. He has not received any followup in the clinic recently. Due to acute shortness of breath the patient was seen in the emergency room at [facility] last month. He was felt to be congestive heart failure never seen in the heart failure clinic. He was labeled as acute diastolic heart failure. However the patient is well-documented severe systolic heart failure now with symptoms consistent with low output as well as pulmonary congestion. The patient reportedly received Lasix in the emergency room with improvement and then was prescribed in the heart failure clinic intermittent when necessary Lasix. Arrangements were made for followup in my clinic. The patient has also severe peripheral vascular disease including carotid artery disease that is not amenable to surgery. The patient state that he still significantly short of breath. He has extremely poor exercise tolerance. He has orthopnea and PND. He does not demonstrate peripheral edema but according to his wife cannot walk from the bedroom to the bathroom without giving out. His had no recurrent syncope. There is also no associated chest pain. Patient has a long-standing history of chronic dyspnea but has significantly worsened.   PMH: reviewed and listed in Problem List in Electronic Records (and see below) Past Medical History  Diagnosis Date  . Hypertension   . History of pulmonary embolism     No pulmonary embolus on the current CT scan as of 03/01/2009  .  Carotid artery stenosis      1. Minimal proximal left common carotid artery stenosis (20-30%).   Marland Kitchen COPD (chronic obstructive pulmonary disease)   . Pulmonary nodules   . Swallowing difficulty   . CAD (coronary artery disease)     Coronary artery bypass grafting 1995 at [facility].   Past Surgical History  Procedure Date  . Cardiac catheterization 2007    revealing patent grafts  . Coronary artery bypass graft 1995  . Prostate surgery     Hx of bypass prostate infections    Allergies/SH/FHX : available in Electronic Records for review  No Known Allergies History   Social History  . Marital Status: Married    Spouse Name: N/A    Number of Children: N/A  . Years of Education: N/A   Occupational History  . Not on file.   Social History Main Topics  . Smoking status: Former Smoker    Quit date: 08/13/1976  . Smokeless tobacco: Not on file  . Alcohol Use: No  . Drug Use: No  . Sexually Active: Not on file   Other Topics Concern  . Not on file   Social History Narrative   Married   Family History  Problem Relation Age of Onset  . Heart disease Neg Hx     Medications: Current Outpatient Prescriptions  Medication Sig Dispense Refill  . albuterol (PROVENTIL HFA;VENTOLIN HFA) 108 (90 BASE) MCG/ACT inhaler Inhale 2 puffs into the lungs every 6 (six) hours as needed. For shortness of breath      . aspirin 325 MG tablet Take 325 mg by mouth daily.      Marland Kitchen  bisoprolol-hydrochlorothiazide (ZIAC) 2.5-6.25 MG per tablet Take 1 tablet by mouth daily.      . furosemide (LASIX) 20 MG tablet Take 20 mg by mouth as needed. Take one tablet by mouth every day as needed for shortness of breath and fluid retention.      . GuaiFENesin (MUCINEX PO) Take 1 tablet by mouth 2 (two) times daily as needed. For cough      . METHYLPREDNISOLONE, PAK, PO Take 1-6 tablets by mouth daily. Starting 08/04/11 take 6 tabs day 1, 5 tabs day 2, 4 tabs day 3, 3 tabs day 4, 2 tabs day 5, 1 tab day 6        . potassium chloride SA (K-DUR,KLOR-CON) 20 MEQ tablet Take 1 tablet (20 mEq total) by mouth daily. Every Monday and Friday  10 tablet  3  . simvastatin (ZOCOR) 80 MG tablet Take 80 mg by mouth at bedtime.        Marland Kitchen zolpidem (AMBIEN) 5 MG tablet Take 5 mg by mouth at bedtime as needed.        ROS: No nausea or vomiting. No fever or chills.No melena or hematochezia.No bleeding. Claudication. The remainder of the 12 point review of system is otherwise negative.  Physical Exam: BP 117/59  Pulse 89  Ht 5\' 7"  (1.702 m)  Wt 140 lb (63.504 kg)  BMI 21.93 kg/m2 General: Cachectic appearing white male short of breath. HEENT. Pupils isocoric conjunctiva clear oropharynx clear Neck: Left carotid bruit. No carotid pulse on the right side. JVP is 10 cm. No thyromegaly nonnodular thyroid. Lungs: Scattered rhonchi bilaterally with crackles at the bases. Cardiac: Regular rate and rhythm with normal S1 and S2-S3 is present. 2/6 holosystolic murmur at the apex. Vascular: No edema. Absent dorsalis pedis and posterior tibial pulses Skin: Warm and dry Physcologic: Depressed affect Neurologic: Alert and oriented grossly nonfocal Lymphatics: Within normal limits.  12lead ECG: Not available Limited bedside ECHO:N/A No images are attached to the encounter.   Assessment and Plan  Postsurgical aortocoronary bypass status He has known severe LV dysfunction. He reports no chest pain. In 2007 autograft were patent but had severe diffuse distal disease. He now has worsened ejection fraction with an EF of less than 20%. Left ventricular diastolic filling pattern was consistent with restrictive filling pattern. There is moderate mitral regurgitation and moderate tricuspid regurgitation as well as pulmonary hypertension. The patient is currently in  low output state with episodes of recurrent congestive heart failure. The patient will be electively admitted tomorrow [facility] for intravenous milrinone as well  as adjustment of heart failure medications and additional diuresis. I told the patient to take an extra Lasix of 20 mg today.  NONSPECIFIC ABNORMAL UNSPEC CV FUNCTION STUDY Anterior and apical scar by prior Myoview.  Occlusion and stenosis of carotid artery without mention of cerebral infarction Patient has a left carotid bruit. He has an occluded right carotid artery and no further intervention was left for him.  POSTURAL HYPOTENSION Resolved.  ESSENTIAL HYPERTENSION, BENIGN Blood pressure well controlled.  PVD Suspect patient has claudication. We will obtain ABIs in the hospitalization.   Patient Active Problem List  Diagnosis  . HYPERLIPIDEMIA  . ESSENTIAL HYPERTENSION, BENIGN  . HTN CKD UNS W/CKD STAGE I THRU STAGE IV/UNS  . CHF  . Occlusion and stenosis of carotid artery without mention of cerebral infarction  . PVD  . POSTURAL HYPOTENSION  . CHRONIC KIDNEY DISEASE UNSPECIFIED  . FLANK PAIN, LEFT  . NONSPECIFIC ABNORMAL UNSPEC  CV FUNCTION STUDY  . PERSONAL HISTORY, VENOUS THROMBOSIS AND EMBOLISM  . Postsurgical aortocoronary bypass status  . Coronary atherosclerosis of native coronary artery

## 2011-08-30 NOTE — Patient Instructions (Signed)
   Admit tomorrow for IV Milrinone  [facility] will call in the morning when bed is ready Follow up as needed

## 2011-08-30 NOTE — Assessment & Plan Note (Signed)
Anterior and apical scar by prior Myoview.

## 2011-08-30 NOTE — Assessment & Plan Note (Signed)
Patient has a left carotid bruit. He has an occluded right carotid artery and no further intervention was left for him.

## 2011-08-30 NOTE — Assessment & Plan Note (Signed)
Resolved

## 2011-08-30 NOTE — Assessment & Plan Note (Signed)
Suspect patient has claudication. We will obtain ABIs in the hospitalization.

## 2011-08-31 ENCOUNTER — Encounter: Payer: Self-pay | Admitting: Cardiology

## 2011-08-31 DIAGNOSIS — I5023 Acute on chronic systolic (congestive) heart failure: Secondary | ICD-10-CM

## 2011-09-04 NOTE — Telephone Encounter (Signed)
[employer] Notification#[phone]

## 2011-09-12 ENCOUNTER — Other Ambulatory Visit: Payer: Self-pay | Admitting: *Deleted

## 2011-09-12 DIAGNOSIS — R0602 Shortness of breath: Secondary | ICD-10-CM

## 2011-09-12 DIAGNOSIS — I509 Heart failure, unspecified: Secondary | ICD-10-CM

## 2011-09-13 ENCOUNTER — Encounter: Payer: Self-pay | Admitting: Physician Assistant

## 2011-09-13 ENCOUNTER — Ambulatory Visit (INDEPENDENT_AMBULATORY_CARE_PROVIDER_SITE_OTHER): Payer: Medicare Other | Admitting: *Deleted

## 2011-09-13 VITALS — BP 98/59 | HR 69 | Ht 67.0 in | Wt 137.4 lb

## 2011-09-13 DIAGNOSIS — I2510 Atherosclerotic heart disease of native coronary artery without angina pectoris: Secondary | ICD-10-CM

## 2011-09-15 NOTE — Progress Notes (Signed)
Patient in office for nurse visit on 09/13/2011 for orthostatics & EKG.  Patient also had OV with PMD (Vyas) the same evening.    Follow up call to patient today (09/15/2011) - spoke with wife.  States visit with PMD was good, MD said he was doing fine.  Home Health also started coming out yesterday.  Wife states he is feeling a lot better.

## 2011-09-20 NOTE — Progress Notes (Addendum)
Appreciate f/u call to pt. If he develops any symptoms of progressive SOB, or progressive increase in weight, before his next scheduled appt, he is to contact our office for further instructions.

## 2011-09-21 NOTE — Progress Notes (Signed)
Noted  

## 2011-09-28 ENCOUNTER — Encounter: Payer: Medicare Other | Admitting: Cardiology

## 2011-10-23 ENCOUNTER — Encounter: Payer: Self-pay | Admitting: Cardiology

## 2011-10-23 ENCOUNTER — Ambulatory Visit (INDEPENDENT_AMBULATORY_CARE_PROVIDER_SITE_OTHER): Payer: Medicare Other | Admitting: Cardiology

## 2011-10-23 VITALS — BP 110/50 | HR 63 | Ht 67.0 in | Wt 139.2 lb

## 2011-10-23 DIAGNOSIS — I6529 Occlusion and stenosis of unspecified carotid artery: Secondary | ICD-10-CM

## 2011-10-23 DIAGNOSIS — I509 Heart failure, unspecified: Secondary | ICD-10-CM

## 2011-10-23 DIAGNOSIS — I739 Peripheral vascular disease, unspecified: Secondary | ICD-10-CM

## 2011-10-23 DIAGNOSIS — Z951 Presence of aortocoronary bypass graft: Secondary | ICD-10-CM

## 2011-10-23 DIAGNOSIS — N189 Chronic kidney disease, unspecified: Secondary | ICD-10-CM

## 2011-10-23 DIAGNOSIS — E785 Hyperlipidemia, unspecified: Secondary | ICD-10-CM

## 2011-10-23 DIAGNOSIS — I10 Essential (primary) hypertension: Secondary | ICD-10-CM

## 2011-10-23 NOTE — Patient Instructions (Addendum)
Your physician recommends that you schedule a follow-up appointment in: 3-4 months with Dr. Andee Lineman. Your physician recommends that you continue on your current medications as directed. Please refer to the Current Medication list given to you today.  Your physician recommends that you return for lab work in: today at [facility] for [employer].

## 2011-10-23 NOTE — Progress Notes (Signed)
HPI The patient has a history of an ischemic cardiomyopathy EF of less than 20%. At the time of his last visit with Dr. Andee Lineman he was hospitalized for management of decompensated heart failure.  He was treated with dobutamine and diuresis.  Since that hospitalization he and his wife think that he had been doing very well. His weights have been stable and airway new routinely. He's trying to watch his salt. He's having no acute shortness of breath, PND or orthopnea. He's had no chest pressure, neck or arm discomfort. He's had no swelling. He has some mild orthostatic symptoms. He has recently had a headache and is being evaluated for this.  No Known Allergies  Current Outpatient Prescriptions  Medication Sig Dispense Refill  . albuterol (PROVENTIL HFA;VENTOLIN HFA) 108 (90 BASE) MCG/ACT inhaler Inhale 2 puffs into the lungs every 6 (six) hours as needed. For shortness of breath      . aspirin 325 MG tablet Take 325 mg by mouth daily.      . bisoprolol-hydrochlorothiazide (ZIAC) 2.5-6.25 MG per tablet Take 1 tablet by mouth daily.      . digoxin (LANOXIN) 0.125 MG tablet Take 125 mcg by mouth daily.      . fish oil-omega-3 fatty acids 1000 MG capsule Take 2 g by mouth daily.      . GuaiFENesin (MUCINEX PO) Take 1 tablet by mouth 2 (two) times daily as needed. For cough      . hydrALAZINE (APRESOLINE) 10 MG tablet Take 10 mg by mouth 3 (three) times daily.      . simvastatin (ZOCOR) 80 MG tablet Take 80 mg by mouth at bedtime.        Marland Kitchen spironolactone (ALDACTONE) 25 MG tablet Take 25 mg by mouth daily.      Marland Kitchen zolpidem (AMBIEN) 5 MG tablet Take 5 mg by mouth at bedtime as needed.        Past Medical History  Diagnosis Date  . Hypertension   . History of pulmonary embolism     No pulmonary embolus on the current CT scan as of 03/01/2009  . Carotid artery stenosis      Status post CT of head and neck. Right common carotid artery and right internal carotid artery were occluded not contiguous  string sign detected. Prominent irregular calcified plaque noted in the left carotid artery with an estimated 60% diameter stenosis. Also 75% stenosis in the proximal vertebral artery..  . COPD (chronic obstructive pulmonary disease)   . Pulmonary nodules   . Swallowing difficulty   . CAD (coronary artery disease)     Coronary artery bypass grafting 1995 at [facility].    Past Surgical History  Procedure Date  . Cardiac catheterization 2007    revealing patent grafts  . Coronary artery bypass graft 1995  . Prostate surgery     Hx of bypass prostate infections    ROS:  Positive for orthostasis, "I feel like I have fluid in my head." Otherwise as stated in the HPI and negative for all other systems.  PHYSICAL EXAM BP 110/50  Pulse 63  Ht 5\' 7"  (1.702 m)  Wt 139 lb 4 oz (63.163 kg)  BMI 21.81 kg/m2 GENERAL:  Frail appearing HEENT:  Pupils equal round and reactive, fundi not visualized, oral mucosa unremarkable, dentures NECK:  No jugular venous distention, waveform within normal limits, carotid upstroke brisk and symmetric, left carotid bruit, no thyromegaly LYMPHATICS:  No cervical, inguinal adenopathy LUNGS:  Clear to auscultation  bilaterally BACK:  No CVA tenderness CHEST:  Well healed sternotomy scar. HEART:  Slight RV lift,S1 and S2 within normal limits, no S3, no S4, no clicks, no rubs, no murmurs ABD:  Flat, positive bowel sounds normal in frequency in pitch, no bruits, no rebound, no guarding, no midline pulsatile mass, no hepatomegaly, no splenomegaly EXT:  2 plus pulses upper, , absent dorsalis pedis bilaterally but 2+ posterior tibialis bilaterally, no edema, no cyanosis no clubbing SKIN:  No rashes no nodules, telangiectasias (birthmarks), easy bruising NEURO:  Cranial nerves II through XII grossly intact, motor grossly intact throughout PSYCH:  Cognitively intact, oriented to person place and time   ASSESSMENT AND PLAN  CABG -  I have reviewed the records  from his recent hospitalization. He's doing quite well. Part of this is probably compliance. At this point I will check a basic metabolic profile but otherwise not change his medications. We discussed salt and fluid restriction again.  Of note they did say that a defibrillator have been discussed years ago. I suspect it was deferred because of his advanced age and frail condition. I will leave this further conversation if there is to be one to Dr. Andee Lineman   Carotid Artery Stenosis -  This is being managed medically.   POSTURAL HYPOTENSION  We discussed conservative management of this. He still has mild symptoms.   ESSENTIAL HYPERTENSION, BENIGN  Blood pressure well controlled. His orthostasis and somewhat low blood pressure does not allow for further med titration.   PVD  He denies any ongoing claudication.

## 2011-10-27 ENCOUNTER — Telehealth: Payer: Self-pay | Admitting: *Deleted

## 2011-10-27 NOTE — Telephone Encounter (Signed)
Message copied by Eustace Moore on Fri Oct 27, 2011  9:20 AM ------      Message from: Rollene Rotunda      Created: Thu Oct 26, 2011  9:24 PM       [facility]

## 2011-10-27 NOTE — Telephone Encounter (Signed)
Patient informed, spoke with wife.

## 2011-11-12 ENCOUNTER — Other Ambulatory Visit: Payer: Self-pay | Admitting: Cardiology

## 2012-02-22 ENCOUNTER — Ambulatory Visit: Payer: Medicare Other | Admitting: Cardiology

## 2012-04-02 ENCOUNTER — Other Ambulatory Visit: Payer: Self-pay | Admitting: Physician Assistant

## 2012-04-27 ENCOUNTER — Other Ambulatory Visit: Payer: Self-pay | Admitting: Physician Assistant

## 2012-06-19 DIAGNOSIS — R079 Chest pain, unspecified: Secondary | ICD-10-CM

## 2012-07-04 ENCOUNTER — Other Ambulatory Visit: Payer: Self-pay | Admitting: Cardiology

## 2012-07-08 ENCOUNTER — Ambulatory Visit (INDEPENDENT_AMBULATORY_CARE_PROVIDER_SITE_OTHER): Payer: Medicare Other | Admitting: Physician Assistant

## 2012-07-08 ENCOUNTER — Encounter: Payer: Self-pay | Admitting: Physician Assistant

## 2012-07-08 VITALS — BP 117/55 | HR 81 | Ht 67.0 in | Wt 135.8 lb

## 2012-07-08 DIAGNOSIS — E785 Hyperlipidemia, unspecified: Secondary | ICD-10-CM

## 2012-07-08 DIAGNOSIS — I255 Ischemic cardiomyopathy: Secondary | ICD-10-CM | POA: Insufficient documentation

## 2012-07-08 DIAGNOSIS — N189 Chronic kidney disease, unspecified: Secondary | ICD-10-CM

## 2012-07-08 DIAGNOSIS — I10 Essential (primary) hypertension: Secondary | ICD-10-CM

## 2012-07-08 DIAGNOSIS — I5022 Chronic systolic (congestive) heart failure: Secondary | ICD-10-CM

## 2012-07-08 DIAGNOSIS — I2589 Other forms of chronic ischemic heart disease: Secondary | ICD-10-CM

## 2012-07-08 MED ORDER — ASPIRIN EC 81 MG PO TBEC: 81.0000 mg | DELAYED_RELEASE_TABLET | Freq: Every day | ORAL | Status: DC

## 2012-07-08 NOTE — Assessment & Plan Note (Signed)
Well-controlled on current regimen. ?

## 2012-07-08 NOTE — Assessment & Plan Note (Signed)
We'll request most recent followup labs from Dr. Sherril Croon' office.

## 2012-07-08 NOTE — Progress Notes (Addendum)
Primary Cardiologist: Rollene Rotunda, MD   HPI: Post hospital followup from [facility], seen in consultation by Dr. Diona Browner recently for evaluation of atypical CP. Nondiagnostic troponins of 0.03 and 0.04. No further workup recommended, with instructions to resume home medication regimen.  The patient presents with his wife, who essentially provides the history. His recent bout with shingles continues to improve. He has been seen by Dr. Sherril Croon in followup, and has had post hospital followup labs. Regarding CP, he refers to this as a "soreness", which is exacerbated by movement. He denies symptoms suggestive of decompensated heart failure. Of note, he has lost 4 pounds since last OV.  No Known Allergies  Current Outpatient Prescriptions  Medication Sig Dispense Refill  . albuterol (PROVENTIL HFA;VENTOLIN HFA) 108 (90 BASE) MCG/ACT inhaler Inhale 2 puffs into the lungs every 6 (six) hours as needed. For shortness of breath      . bisoprolol-hydrochlorothiazide (ZIAC) 5-6.25 MG per tablet Take 1 tablet by mouth daily.      . citalopram (CELEXA) 10 MG tablet Take 10 mg by mouth daily.      . digoxin (LANOXIN) 0.125 MG tablet TAKE ONE TABLET BY MOUTH EVERY DAY  30 tablet  0  . eplerenone (INSPRA) 25 MG tablet Take 25 mg by mouth daily.      . fish oil-omega-3 fatty acids 1000 MG capsule Take 2 g by mouth daily.      Marland Kitchen gabapentin (NEURONTIN) 300 MG capsule Take 300 mg by mouth 3 (three) times daily.      . GuaiFENesin (MUCINEX PO) Take 1 tablet by mouth 2 (two) times daily as needed. For cough      . hydrALAZINE (APRESOLINE) 10 MG tablet Take 10 mg by mouth 3 (three) times daily.      . naproxen sodium (ANAPROX) 220 MG tablet Take 220 mg by mouth as needed.      Marland Kitchen oxyCODONE (OXYCONTIN) 10 MG 12 hr tablet Take 10 mg by mouth as needed for pain.      . simvastatin (ZOCOR) 80 MG tablet Take 80 mg by mouth at bedtime.        . traZODone (DESYREL) 50 MG tablet Take 50 mg by mouth at bedtime.      . valACYclovir  (VALTREX) 1000 MG tablet Take 1,000 mg by mouth daily.      Marland Kitchen zolpidem (AMBIEN) 5 MG tablet Take 5 mg by mouth at bedtime as needed.      Marland Kitchen aspirin EC 81 MG tablet Take 1 tablet (81 mg total) by mouth daily.       No current facility-administered medications for this visit.    Past Medical History  Diagnosis Date  . Hypertension   . History of pulmonary embolism     No pulmonary embolus on the current CT scan as of 03/01/2009  . Carotid artery stenosis      Status post CT of head and neck. Right common carotid artery and right internal carotid artery were occluded not contiguous string sign detected. Prominent irregular calcified plaque noted in the left carotid artery with an estimated 60% diameter stenosis. Also 75% stenosis in the proximal vertebral artery..  . COPD (chronic obstructive pulmonary disease)   . Pulmonary nodules   . Swallowing difficulty   . CAD (coronary artery disease)     Coronary artery bypass grafting 1995 at [facility].  . Ischemic cardiomyopathy   . Chronic systolic heart failure   . Chronic kidney disease  Past Surgical History  Procedure Laterality Date  . Cardiac catheterization  2007    revealing patent grafts  . Coronary artery bypass graft  1995  . Prostate surgery      Hx of bypass prostate infections    History   Social History  . Marital Status: Married    Spouse Name: N/A    Number of Children: N/A  . Years of Education: N/A   Occupational History  . Not on file.   Social History Main Topics  . Smoking status: Former Smoker    Quit date: 08/13/1976  . Smokeless tobacco: Not on file  . Alcohol Use: No  . Drug Use: No  . Sexually Active: Not on file   Other Topics Concern  . Not on file   Social History Narrative   Married    Family History  Problem Relation Age of Onset  . Heart disease Neg Hx     ROS: no nausea, vomiting; no fever, chills; no melena, hematochezia; no claudication  PHYSICAL EXAM: BP 117/55   Pulse 81  Ht 5\' 7"  (1.702 m)  Wt 135 lb 12.8 oz (61.598 kg)  BMI 21.26 kg/m2 GENERAL: 79 year-old male; NAD HEENT: NCAT, PERRLA, EOMI; sclera clear; no xanthelasma NECK: bilateral carotid bruits (L > R); no JVD; no TM LUNGS: CTA bilaterally CARDIAC: RRR (S1, S2); no significant murmurs; no rubs or gallops ABDOMEN: soft, non-tender; intact BS EXTREMETIES: no significant peripheral edema SKIN: warm/dry; large area of ecchymosis overlying the left precordium; erythematous rash below the left rib cage MUSCULOSKELETAL: no joint deformity NEURO: no focal deficit; NL affect   EKG: reviewed and available in Electronic Records   ASSESSMENT & PLAN:  Ischemic cardiomyopathy No further workup currently indicated. Patient recently presented with atypical CP, in the context of herpes zoster infection, currently resolving. His discomfort is exacerbated by movement. Recent troponins were essentially negative. Recommend continued conservative management, as previously outlined by Dr. Andee Lineman, including consideration for ICD implantation. Regarding medications, will decrease ASA to 81 mg daily.  Chronic systolic heart failure Euvolemic by history and exam. Patient has lost 4 pounds since last OV. He has documented intolerance to Aldactone, secondary to development of gynecomastia.  Chronic kidney disease We'll request most recent followup labs from Dr. Sherril Croon' office.  Occlusion and stenosis of carotid artery without mention of cerebral infarction Previously deemed not a suitable candidate for aggressive management.  ESSENTIAL HYPERTENSION, BENIGN Well-controlled on current regimen  HYPERLIPIDEMIA On full dose simvastatin, followed by primary M.D. Recommend aggressive management with target LDL 70 or less, if feasible.    Brett Baldwin, PAC

## 2012-07-08 NOTE — Assessment & Plan Note (Signed)
Euvolemic by history and exam. Patient has lost 4 pounds since last OV. He has documented intolerance to Aldactone, secondary to development of gynecomastia.

## 2012-07-08 NOTE — Assessment & Plan Note (Signed)
On full dose simvastatin, followed by primary M.D. Recommend aggressive management with target LDL 70 or less, if feasible.

## 2012-07-08 NOTE — Assessment & Plan Note (Addendum)
No further workup currently indicated. Patient recently presented with atypical CP, in the context of herpes zoster infection, currently resolving. His discomfort is exacerbated by movement. Recent troponins were essentially negative. Recommend continued conservative management, as previously outlined by Dr. Andee Lineman, including consideration for ICD implantation. Regarding medications, will decrease ASA to 81 mg daily.

## 2012-07-08 NOTE — Assessment & Plan Note (Signed)
Previously deemed not a suitable candidate for aggressive management.

## 2012-07-08 NOTE — Patient Instructions (Addendum)
   Decrease Aspirin to 81mg  daily Continue all other current medications. Follow up in  3 months

## 2012-07-11 DIAGNOSIS — R079 Chest pain, unspecified: Secondary | ICD-10-CM

## 2012-08-02 ENCOUNTER — Other Ambulatory Visit: Payer: Self-pay | Admitting: Physician Assistant

## 2012-10-24 ENCOUNTER — Encounter: Payer: Self-pay | Admitting: Cardiology

## 2012-10-24 ENCOUNTER — Ambulatory Visit (INDEPENDENT_AMBULATORY_CARE_PROVIDER_SITE_OTHER): Payer: Medicare Other | Admitting: Cardiology

## 2012-10-24 VITALS — BP 113/49 | HR 63 | Ht 67.0 in | Wt 142.8 lb

## 2012-10-24 DIAGNOSIS — I739 Peripheral vascular disease, unspecified: Secondary | ICD-10-CM

## 2012-10-24 DIAGNOSIS — I10 Essential (primary) hypertension: Secondary | ICD-10-CM

## 2012-10-24 DIAGNOSIS — I509 Heart failure, unspecified: Secondary | ICD-10-CM

## 2012-10-24 NOTE — Patient Instructions (Addendum)

## 2012-10-24 NOTE — Progress Notes (Signed)
HPI The patient has a history of an ischemic cardiomyopathy EF of less than 20%. Since I last saw him in the office he was again hospitalized for volume overload. He also had hypersomnolence possibly related to overmedication for treatment of shingles.  I did see him in consultation in the hospital. At that time he has a nonanginal chest pain. He did have problems with orthostasis. However, this was managed conservatively.  Since then he has done well. He doesn't watch his salt in particular but his wife is weighing him and giving him when necessary diuretics when he needs it rarely. He still bothered by this single pain but has no acute shortness of breath, PND or orthopnea. He's had no palpitations, presyncope or syncope. Dizziness and orthostatic symptoms are improved.  No Known Allergies  Current Outpatient Prescriptions  Medication Sig Dispense Refill  . acyclovir (ZOVIRAX) 200 MG capsule Take 200 mg by mouth 5 (five) times daily.      Marland Kitchen aspirin 325 MG EC tablet Take 325 mg by mouth daily.      . bisoprolol-hydrochlorothiazide (ZIAC) 5-6.25 MG per tablet Take 1 tablet by mouth daily.      . citalopram (CELEXA) 10 MG tablet Take 10 mg by mouth daily.      . digoxin (LANOXIN) 0.125 MG tablet TAKE ONE TABLET BY MOUTH EVERY DAY  30 tablet  6  . eplerenone (INSPRA) 25 MG tablet Take 25 mg by mouth daily.      . fish oil-omega-3 fatty acids 1000 MG capsule Take 2 g by mouth daily.      Marland Kitchen gabapentin (NEURONTIN) 300 MG capsule Take 300 mg by mouth 3 (three) times daily.      . GuaiFENesin (MUCINEX PO) Take 1 tablet by mouth 2 (two) times daily as needed. For cough      . hydrALAZINE (APRESOLINE) 10 MG tablet Take 10 mg by mouth 3 (three) times daily.      Marland Kitchen HYDROcodone-acetaminophen (NORCO/VICODIN) 5-325 MG per tablet Take 1 tablet by mouth every 6 (six) hours as needed for pain.      . naproxen sodium (ANAPROX) 220 MG tablet Take 220 mg by mouth as needed.      . pantoprazole (PROTONIX) 40 MG  tablet Take 40 mg by mouth daily.      . simvastatin (ZOCOR) 80 MG tablet Take 80 mg by mouth at bedtime.        . traZODone (DESYREL) 50 MG tablet Take 50 mg by mouth at bedtime.      . vitamin C (ASCORBIC ACID) 250 MG tablet Take 250 mg by mouth daily.      Marland Kitchen zolpidem (AMBIEN) 5 MG tablet Take 5 mg by mouth at bedtime as needed.       No current facility-administered medications for this visit.    Past Medical History  Diagnosis Date  . Hypertension   . History of pulmonary embolism     No pulmonary embolus on the current CT scan as of 03/01/2009  . Carotid artery stenosis      Status post CT of head and neck. Right common carotid artery and right internal carotid artery were occluded not contiguous string sign detected. Prominent irregular calcified plaque noted in the left carotid artery with an estimated 60% diameter stenosis. Also 75% stenosis in the proximal vertebral artery..  . COPD (chronic obstructive pulmonary disease)   . Pulmonary nodules   . Swallowing difficulty   . CAD (coronary artery disease)  Coronary artery bypass grafting 1995 at [facility].  . Ischemic cardiomyopathy   . Chronic systolic heart failure   . Chronic kidney disease     Past Surgical History  Procedure Laterality Date  . Cardiac catheterization  2007    revealing patent grafts  . Coronary artery bypass graft  1995  . Prostate surgery      Hx of bypass prostate infections    ROS:  As stated in the HPI and negative for all other systems.  PHYSICAL EXAM BP 113/49  Pulse 63  Ht 5\' 7"  (1.702 m)  Wt 142 lb 12.8 oz (64.774 kg)  BMI 22.36 kg/m2  SpO2 96% GENERAL:  Frail appearing HEENT:  Pupils equal round and reactive, fundi not visualized, oral mucosa unremarkable, dentures NECK:  No jugular venous distention, waveform within normal limits, carotid upstroke brisk and symmetric, left carotid bruit, no thyromegaly BACK:  No CVA tenderness CHEST:  Well healed sternotomy scar. HEART:   Slight RV lift,S1 and S2 within normal limits, no S3, no S4, no clicks, no rubs, no murmurs ABD:  Flat, positive bowel sounds normal in frequency in pitch, no bruits, no rebound, no guarding, no midline pulsatile mass, no hepatomegaly, no splenomegaly EXT:  2 plus pulses upper, , absent dorsalis pedis bilaterally but 2+ posterior tibialis bilaterally, trace ankle edema, no cyanosis no clubbing SKIN:  No rashes no nodules, telangiectasias (birthmarks), easy bruising, shingles left upper chest.    ASSESSMENT AND PLAN  CABG -  He has no acute symptoms. Of note given his advanced age and frail condition a defibrillator is not indicated.  Carotid Artery Stenosis -  The patient has preferred medical management for this.  POSTURAL HYPOTENSION  This is improved. No change in therapy is indicated.  ESSENTIAL HYPERTENSION, BENIGN  Blood pressure well controlled. His orthostasis and somewhat low blood pressure does not allow for further med titration.

## 2012-10-28 ENCOUNTER — Other Ambulatory Visit: Payer: Self-pay | Admitting: Cardiology

## 2012-10-28 MED ORDER — HYDRALAZINE HCL 10 MG PO TABS: 10.0000 mg | ORAL_TABLET | Freq: Three times a day (TID) | ORAL | Status: DC

## 2013-01-13 ENCOUNTER — Encounter: Payer: Self-pay | Admitting: Neurology

## 2013-01-13 ENCOUNTER — Ambulatory Visit (INDEPENDENT_AMBULATORY_CARE_PROVIDER_SITE_OTHER): Payer: Medicare Other | Admitting: Neurology

## 2013-01-13 VITALS — BP 140/68 | HR 78 | Temp 98.5°F

## 2013-01-13 DIAGNOSIS — R269 Unspecified abnormalities of gait and mobility: Secondary | ICD-10-CM

## 2013-01-13 DIAGNOSIS — G253 Myoclonus: Secondary | ICD-10-CM

## 2013-01-13 NOTE — Patient Instructions (Addendum)
I don't think you had a mini stroke.  You seem to have jerking of the body, but I do not know what this means.  I think we need to cast a wide net to look for different causes of this jerking. 1.  We will check blood work. 2.  If blood work unremarkable, we may need to consider a spinal tap. 3. Call in 2 weeks with an update.  Let's schedule a follow up visit in 4 weeks.

## 2013-01-13 NOTE — Progress Notes (Signed)
NEUROLOGY CONSULTATION NOTE  Goodwin Kamphaus MRN: [id number] DOB: 05-01-1923  Referring provider: Dr. Sherril Croon Primary care provider: Dr. Sherril Croon  Reason for consult:  Weakness.  Questionable TIA  HISTORY OF PRESENT ILLNESS: Brett Baldwin is an 79 year old right-handed man with ischemic cardiomyopathy (EF <20%), COPD, CAD, CKD, HTN and history of pulmonary embolism who presents for TIA.  He is accompanied by his wife and son.  Records and images were personally reviewed where available.   He was recently admitted to an outside hospital for TIA symptoms.  He had walked into the room to tell his wife something.  When he turned around to walk out, he was frozen and shaking all over.  He was unable to walk or move his legs.  In the hospital, it was reported that he may have had left leg weakness.  MRI of brain without contrast was performed on 12/11/12, which revealed atrophy, small vessel disease and occluded right internal carotid artery.  These findings were all stable when compared to prior imaging on 01/09/12.  No acute stroke was seen.  He was sent to rehab, but was quickly discharged because he was walking well.  He had a couple of instances where he would be unable to walk, but would resolve in a day or two.  This is the longest period he has not been able to walk (about 5 days).  He has jerking spontaneously while sitting or laying in bed as well.  He notes some mild difficulty swallowing.  He had not had any lightheadedness.  His wife reports that once at night, he seemed a little confused, but overall he has been lucid without any signs of encephalopathy.  He reports pain in the anterior left thigh.  He denies numbness and tingling in the feet.  Of note, he had a case of shingles last April, involving the thoracic dermatomes on the left.  He does not drink alcohol.  He has not had any unusual weight loss.  He has had no recent illnesses or fevers.  08/17/11 Echo: LVEF <20%, abnormal left  ventricular diastolic restrictive filling pattern and consistent with both elevated mean left atrial pressure and elevated left ventricular end-diastolic pressure.  04/16/09 Carotid dopplers: chronic right ICA occlusion, 50-69% stenosis in proximal left ICA.  Antegrade flow in both vertebral arteries.  Current medications include ASA 81mg , digoxin, hydralazine, bisoprolol-hydrochlorothiazide, and simvastatin.  PAST MEDICAL HISTORY: Past Medical History  Diagnosis Date  . Hypertension   . History of pulmonary embolism     No pulmonary embolus on the current CT scan as of 03/01/2009  . Carotid artery stenosis      Status post CT of head and neck. Right common carotid artery and right internal carotid artery were occluded not contiguous string sign detected. Prominent irregular calcified plaque noted in the left carotid artery with an estimated 60% diameter stenosis. Also 75% stenosis in the proximal vertebral artery..  . COPD (chronic obstructive pulmonary disease)   . Pulmonary nodules   . Swallowing difficulty   . CAD (coronary artery disease)     Coronary artery bypass grafting 1995 at [facility].  . Ischemic cardiomyopathy   . Chronic systolic heart failure   . Chronic kidney disease     PAST SURGICAL HISTORY: Past Surgical History  Procedure Laterality Date  . Cardiac catheterization  2007    revealing patent grafts  . Coronary artery bypass graft  1995  . Prostate surgery      Hx of  bypass prostate infections    MEDICATIONS: Current Outpatient Prescriptions on File Prior to Visit  Medication Sig Dispense Refill  . acyclovir (ZOVIRAX) 200 MG capsule Take 200 mg by mouth 5 (five) times daily.      Marland Kitchen aspirin 325 MG EC tablet Take 325 mg by mouth daily.      . bisoprolol-hydrochlorothiazide (ZIAC) 5-6.25 MG per tablet Take 1 tablet by mouth daily.      . citalopram (CELEXA) 10 MG tablet Take 10 mg by mouth daily.      . digoxin (LANOXIN) 0.125 MG tablet TAKE ONE TABLET BY  MOUTH EVERY DAY  30 tablet  6  . eplerenone (INSPRA) 25 MG tablet Take 25 mg by mouth daily.      . fish oil-omega-3 fatty acids 1000 MG capsule Take 2 g by mouth daily.      Marland Kitchen gabapentin (NEURONTIN) 300 MG capsule Take 300 mg by mouth 3 (three) times daily.      . GuaiFENesin (MUCINEX PO) Take 1 tablet by mouth 2 (two) times daily as needed. For cough      . hydrALAZINE (APRESOLINE) 10 MG tablet Take 1 tablet (10 mg total) by mouth 3 (three) times daily.  90 tablet  4  . simvastatin (ZOCOR) 80 MG tablet Take 80 mg by mouth at bedtime.        . traZODone (DESYREL) 50 MG tablet Take 50 mg by mouth at bedtime.      . vitamin C (ASCORBIC ACID) 250 MG tablet Take 250 mg by mouth daily.      Marland Kitchen HYDROcodone-acetaminophen (NORCO/VICODIN) 5-325 MG per tablet Take 1 tablet by mouth every 6 (six) hours as needed for pain.      . naproxen sodium (ANAPROX) 220 MG tablet Take 220 mg by mouth as needed.      . pantoprazole (PROTONIX) 40 MG tablet Take 40 mg by mouth daily.      Marland Kitchen zolpidem (AMBIEN) 5 MG tablet Take 5 mg by mouth at bedtime as needed.       No current facility-administered medications on file prior to visit.    ALLERGIES: No Known Allergies  FAMILY HISTORY: Family History  Problem Relation Age of Onset  . Heart disease Neg Hx     SOCIAL HISTORY: History   Social History  . Marital Status: Married    Spouse Name: N/A    Number of Children: N/A  . Years of Education: N/A   Occupational History  . Not on file.   Social History Main Topics  . Smoking status: Former Smoker    Quit date: 08/13/1976  . Smokeless tobacco: Never Used  . Alcohol Use: No  . Drug Use: No  . Sexual Activity: Not on file   Other Topics Concern  . Not on file   Social History Narrative   Married    REVIEW OF SYSTEMS: Constitutional: No fevers, chills, or sweats, no generalized fatigue, change in appetite Eyes: No visual changes, double vision, eye pain Ear, nose and throat: No hearing loss,  ear pain, nasal congestion, sore throat Cardiovascular: No chest pain, palpitations Respiratory:  No shortness of breath at rest or with exertion, wheezes GastrointestinaI: No nausea, vomiting, diarrhea, abdominal pain, fecal incontinence Genitourinary:  No dysuria, urinary retention or frequency Musculoskeletal:  Left thigh pain, back pain Integumentary: No rash, pruritus, skin lesions Neurological: as above Psychiatric: No depression, insomnia, anxiety Endocrine: No palpitations, fatigue, diaphoresis, mood swings, change in appetite, change in weight, increased thirst  Hematologic/Lymphatic:  No anemia, purpura, petechiae. Allergic/Immunologic: no itchy/runny eyes, nasal congestion, recent allergic reactions, rashes  PHYSICAL EXAM: Filed Vitals:   01/13/13 1428  BP: 140/68  Pulse: 78  Temp: 98.5 F (36.9 C)   General: No acute distress Head:  Normocephalic/atraumatic Neck: supple, no paraspinal tenderness, full range of motion Back: No paraspinal tenderness Heart: regular rate and rhythm Lungs: Clear to auscultation bilaterally. Vascular: No carotid bruits. Neurological Exam: Mental status: alert and oriented to person, place, and time, speech fluent and not dysarthric, language intact.   Cranial nerves: CN I: not tested CN II: pupils equal, round and reactive to light, visual fields intact, fundi not visualized. CN III, IV, VI:  full range of motion, no nystagmus, no ptosis CN V: facial sensation intact CN VII: upper and lower face symmetric CN VIII: hearing intact CN IX, X: gag intact, uvula midline CN XI: sternocleidomastoid and trapezius muscles intact CN XII: tongue midline Bulk & Tone: normal, no fasciculations.  No rigidity. Motor:5/5 throughout, myoclonus noted in all extremities.  No bradykinesia.  Did exhibit occasional brief myoclonic jerks in all extremities (legs > arms). Sensation: mildly reduced vibration in the feet.  Pinprick intact Deep Tendon Reflexes:  trace throughout, absent in ankles.  Toes down.  Frontal release signs noted (palmamental, grasp) Finger to nose testing: postural and kinetic tremor/myoclonus with arms outstretched.  No dysmetria.  No asterixis. Gait: unable to stand up on own.  Once on his feet, he exhibits significant shaking in his legs with inability to take a step.  IMPRESSION: Myoclonus and gait instability.  Episodes have been transient.  Etiology unknown with wide potential differential, including toxic-metabolic, dysautonomia, neurodegenerative, or prion disease.  At this point, I don't suspect seizure activity.  PLAN: 1.  Will first check blood for CBC, CMP, copper, ceruloplasmin, Mg, ESR, CRP, thiamine, ammonia, TSH, and heavy metal screen. 2.  If unremarkable, may have to consider LP.  At this point, I don't suspect seizure but EEG is always a possibility.  Discussed with patient and family. 3.  We will get a CD of the actual CT and MRI scans to review. 4.  We will schedule for follow up in 4 weeks.  I asked to call us for update in 2 weeks.  45 minutes spent with patient, over 50% spent counseling and coordinating care.  Thank you for allowing me to take part in the care of this patient.  Shon Millet, DO  CC:  Doreen Beam, MD

## 2013-01-14 LAB — TSH: TSH: 2.15 u[IU]/mL (ref 0.35–5.50)

## 2013-01-14 LAB — CBC WITH DIFFERENTIAL/PLATELET
Basophils Relative: 0.5 % (ref 0.0–3.0)
Eosinophils Absolute: 1.1 10*3/uL — ABNORMAL HIGH (ref 0.0–0.7)
Eosinophils Relative: 8.2 % — ABNORMAL HIGH (ref 0.0–5.0)
HCT: 38.2 % — ABNORMAL LOW (ref 39.0–52.0)
Lymphs Abs: 1.2 10*3/uL (ref 0.7–4.0)
MCHC: 33.8 g/dL (ref 30.0–36.0)
MCV: 93.4 fl (ref 78.0–100.0)
Monocytes Absolute: 1.1 10*3/uL — ABNORMAL HIGH (ref 0.1–1.0)
RBC: 4.08 Mil/uL — ABNORMAL LOW (ref 4.22–5.81)
WBC: 13.5 10*3/uL — ABNORMAL HIGH (ref 4.5–10.5)

## 2013-01-14 LAB — C-REACTIVE PROTEIN: CRP: <0.5 mg/dL (ref 0.5–20.0)

## 2013-01-14 LAB — COMPREHENSIVE METABOLIC PANEL
ALT: 76 U/L — ABNORMAL HIGH (ref 0–53)
AST: 79 U/L — ABNORMAL HIGH (ref 0–37)
Albumin: 3.2 g/dL — ABNORMAL LOW (ref 3.5–5.2)
Alkaline Phosphatase: 389 U/L — ABNORMAL HIGH (ref 39–117)
CO2: 24 mEq/L (ref 19–32)
Creatinine, Ser: 1.9 mg/dL — ABNORMAL HIGH (ref 0.4–1.5)
GFR: 34.9 mL/min — ABNORMAL LOW (ref >60.00)
Glucose, Bld: 99 mg/dL (ref 70–99)
Total Bilirubin: 0.8 mg/dL (ref 0.3–1.2)

## 2013-01-14 LAB — AMMONIA

## 2013-01-14 LAB — MAGNESIUM: Magnesium: 1.9 mg/dL (ref 1.5–2.5)

## 2013-01-15 LAB — HEAVY METALS, BLOOD
Lead: <2 ug/dL (ref <10)
Mercury, B: <4 mcg/L (ref <=10)

## 2013-01-15 LAB — CERULOPLASMIN: Ceruloplasmin: 46 mg/dL (ref 20–60)

## 2013-01-17 ENCOUNTER — Other Ambulatory Visit: Payer: Self-pay | Admitting: Neurology

## 2013-01-17 DIAGNOSIS — G253 Myoclonus: Secondary | ICD-10-CM

## 2013-01-17 DIAGNOSIS — R269 Unspecified abnormalities of gait and mobility: Secondary | ICD-10-CM

## 2013-01-20 ENCOUNTER — Telehealth: Payer: Self-pay | Admitting: Neurology

## 2013-01-20 ENCOUNTER — Other Ambulatory Visit (INDEPENDENT_AMBULATORY_CARE_PROVIDER_SITE_OTHER): Payer: Medicare Other

## 2013-01-20 ENCOUNTER — Other Ambulatory Visit: Payer: Self-pay | Admitting: Neurology

## 2013-01-20 DIAGNOSIS — R269 Unspecified abnormalities of gait and mobility: Secondary | ICD-10-CM

## 2013-01-20 DIAGNOSIS — G253 Myoclonus: Secondary | ICD-10-CM

## 2013-01-20 NOTE — Telephone Encounter (Signed)
Spoke with the patient's wife. Information given as per Dr. Everlena Cooper below. They will go forward with the EEG and they will come back to our office for additional blood work. She also wanted me to let Dr. Everlena Cooper know that when he saw him last Monday he was not able to walk but since Thursday he has been able to walk some with a walker. I told her that I would let Dr. Everlena Cooper know.  **Dr. Everlena Cooper just a FYI.

## 2013-01-20 NOTE — Telephone Encounter (Signed)
Spoke with the patient's wife. Aware of EEG scheduled at [facility] on 11/18 at 2:00 pm; to arrive at 1:45 pm at the Entrance A off of [address]. No other questions at this time.

## 2013-01-20 NOTE — Telephone Encounter (Signed)
Message copied by Benay Spice on Mon Jan 20, 2013  8:41 AM ------      Message from: JAFFE, ADAM R      Created: Fri Jan 17, 2013  7:52 AM       Blood work doesn't reveal anything specific, but the sed rate is elevated.  This is a nonspecific finding but may suggest an inflammatory condition, but at this point I do not know what.  I would like to schedule a routine EEG next.       ----- Message -----         From: [facility]         Sent: 01/14/2013  10:49 AM           To: Cira Servant, DO                   ------

## 2013-01-23 LAB — PARANEOPLASTIC PROFILE 1
Neuronal Nuc Ab (Ri), IFA: <1:10 {titer}
Neuronal Nuclear (Hu) Antibody (IB): <1:10 {titer}
Purkinje Cell (Yo) Autoantobodies- IFA: <1:10 {titer}

## 2013-01-28 ENCOUNTER — Ambulatory Visit (HOSPITAL_COMMUNITY)
Admission: RE | Admit: 2013-01-28 | Discharge: 2013-01-28 | Disposition: A | Discharge status: Home / Self Care | Payer: Medicare Other | Source: Ambulatory Visit | Attending: Neurology | Admitting: Neurology

## 2013-01-28 DIAGNOSIS — G253 Myoclonus: Secondary | ICD-10-CM

## 2013-01-28 DIAGNOSIS — R269 Unspecified abnormalities of gait and mobility: Secondary | ICD-10-CM

## 2013-01-28 DIAGNOSIS — R259 Unspecified abnormal involuntary movements: Secondary | ICD-10-CM | POA: Insufficient documentation

## 2013-01-28 NOTE — Progress Notes (Signed)
EEG Completed; Results Pending  

## 2013-01-29 ENCOUNTER — Telehealth: Payer: Self-pay | Admitting: Neurology

## 2013-01-29 NOTE — Procedures (Signed)
ELECTROENCEPHALOGRAM REPORT  Date of Study: 01/28/2013  Patient's Name: Brett Baldwin MRN: [id number] Date of Birth: 08-31-1923  Indication: spontaneous jerking of the body  Medications: Citalopram, gabapentin, hydrocodone-acetaminophen, trazodone, zolpidem  Technical Summary: This is a multichannel digital EEG recording, using the international 10-20 placement system.  Spike detection software was employed.  Description: The EEG background is symmetric, with a well-developed posterior dominant rhythm alternating at times between 7 and 8 Hz, which is reactive to eye opening and closing.  Diffuse beta activity is seen, with a bilateral frontal preponderance.  No focal or generalized abnormalities are seen.  No focal or generalized epileptiform discharges are seen.  Stage II sleep is not seen.  Hyperventilation and photic stimulation were not performed.  ECG revealed normal cardiac rate and rhythm.  Impression: This is an essentiall normal routine EEG of the awake and drowsy states, without activating procedures. Occasional mild slowing of the alpha background may be of uncertain significance in the elderly, and not a pathologic finding.  Adam R. Everlena Cooper, DO

## 2013-01-29 NOTE — Telephone Encounter (Signed)
I spoke with patient's wife.  EEG and blood work look okay.  A couple of days after his visit with me, he started to do better and was able to walk with the walker.  Then over the last day or two, the shaking is so bad that he cannot stand.  I recommended stopping the gabapentin to see if this may be contributing to these myoclonic symptoms.  He has a follow up with me on 02/10/13.  We will see how he does at that time.  If no improvement, may need to consider LP or other testing.

## 2013-02-10 ENCOUNTER — Other Ambulatory Visit: Payer: Self-pay | Admitting: Neurology

## 2013-02-10 ENCOUNTER — Telehealth: Payer: Self-pay | Admitting: Neurology

## 2013-02-10 ENCOUNTER — Ambulatory Visit (INDEPENDENT_AMBULATORY_CARE_PROVIDER_SITE_OTHER): Payer: Medicare Other | Admitting: Neurology

## 2013-02-10 ENCOUNTER — Encounter: Payer: Self-pay | Admitting: Neurology

## 2013-02-10 VITALS — BP 130/68 | HR 66 | Temp 97.5°F

## 2013-02-10 DIAGNOSIS — G252 Other specified forms of tremor: Secondary | ICD-10-CM

## 2013-02-10 DIAGNOSIS — G250 Essential tremor: Secondary | ICD-10-CM

## 2013-02-10 DIAGNOSIS — R700 Elevated erythrocyte sedimentation rate: Secondary | ICD-10-CM

## 2013-02-10 NOTE — Telephone Encounter (Signed)
Opened in error

## 2013-02-10 NOTE — Progress Notes (Signed)
NEUROLOGY FOLLOW UP OFFICE NOTE  Brett Baldwin [id number]  HISTORY OF PRESENT ILLNESS: Brett Baldwin is an 79 year old right-handed man with ischemic cardiomyopathy (EF <20%), COPD, CAD, CKD, HTN and history of pulmonary embolism who follows up for tremor.  He is accompanied by his wife and daughter.  Records and images were personally reviewed where available.   He was recently admitted to an outside hospital for TIA symptoms.  He had walked into the room to tell his wife something.  When he turned around to walk out, he was frozen and shaking all over.  He was unable to walk or move his legs.  In the hospital, it was reported that he may have had left leg weakness.  MRI of brain without contrast was performed on 12/11/12, which revealed atrophy, small vessel disease and occluded right internal carotid artery.  These findings were all stable when compared to prior imaging on 01/09/12.  No acute stroke was seen.  He was sent to rehab, but was quickly discharged because he was walking well.  He has been having episodes of severe tremor in the legs when he stands up.  It is so severe that he cannot walk.  It doesn't happen all the time, however.  Last visit, I also appreciated irregular jerking of both upper extremities, however he does not exhibit this today.  The main issue is the tremor that occurs when he stands up.  Sometimes, it is associated with feeling of cold in his extremities, however his head and chest feels hot.  He does not have a fever.  It lasts about 2 hours.  About a week and a half ago, I recommended stopping the gabapentin to see if this was contributing.  He has not noticed any improvement.    He also has had decreased appetite.  He does not feel ill.  He just says food doesn't taste right.  He reportedly has lost about 10 pounds since last visit.  He does have remote history of smoking.  He quit about 40 years ago.  He has not had any episodes of confusion.  He reports  some trouble swallowing water but not other liquids or food.  He has longstanding history of sleep problems.  He falls asleep quickly but wakes up after only a couple of hours.  He takes citalopram and trazodone.  His PCP recently started him on mirtazepine.    Labs revealed a sed rate of 109 (repeat was 111).  CRP, ceruloplasmin, magnesium, TSH, heavy metal screen, B1, and paraneoplastic panel were normal.  CBC revealed WBC 13.5, Hgb 12.9, HCT 38.2, PLT 216 and MCV 93.4.  CMP revealed Na 134, K 4.8, CO2 24, glucose 99, BUN 47, Cr 1.9, TB 0.8, AP 389, AST 79 and ALT 76. EEG was normal.  PAST MEDICAL HISTORY: Past Medical History  Diagnosis Date  . Hypertension   . History of pulmonary embolism     No pulmonary embolus on the current CT scan as of 03/01/2009  . Carotid artery stenosis      Status post CT of head and neck. Right common carotid artery and right internal carotid artery were occluded not contiguous string sign detected. Prominent irregular calcified plaque noted in the left carotid artery with an estimated 60% diameter stenosis. Also 75% stenosis in the proximal vertebral artery..  . COPD (chronic obstructive pulmonary disease)   . Pulmonary nodules   . Swallowing difficulty   . CAD (coronary artery disease)  Coronary artery bypass grafting 1995 at [facility]  . Ischemic cardiomyopathy   . Chronic systolic heart failure   . Chronic kidney disease   . Weight loss     MEDICATIONS: Current Outpatient Prescriptions on File Prior to Visit  Medication Sig Dispense Refill  . acyclovir (ZOVIRAX) 200 MG capsule Take 200 mg by mouth 5 (five) times daily.      Marland Kitchen aspirin 325 MG EC tablet Take 325 mg by mouth daily.      . bisoprolol-hydrochlorothiazide (ZIAC) 5-6.25 MG per tablet Take 1 tablet by mouth daily.      . citalopram (CELEXA) 10 MG tablet Take 10 mg by mouth daily.      . digoxin (LANOXIN) 0.125 MG tablet TAKE ONE TABLET BY MOUTH EVERY DAY  30 tablet  6  .  eplerenone (INSPRA) 25 MG tablet Take 25 mg by mouth daily.      . fish oil-omega-3 fatty acids 1000 MG capsule Take 2 g by mouth daily.      . GuaiFENesin (MUCINEX PO) Take 1 tablet by mouth 2 (two) times daily as needed. For cough      . hydrALAZINE (APRESOLINE) 10 MG tablet Take 1 tablet (10 mg total) by mouth 3 (three) times daily.  90 tablet  4  . HYDROcodone-acetaminophen (NORCO/VICODIN) 5-325 MG per tablet Take 1 tablet by mouth every 6 (six) hours as needed for pain.      . naproxen sodium (ANAPROX) 220 MG tablet Take 220 mg by mouth as needed.      . pantoprazole (PROTONIX) 40 MG tablet Take 40 mg by mouth daily.      . simvastatin (ZOCOR) 80 MG tablet Take 80 mg by mouth at bedtime.        . traZODone (DESYREL) 50 MG tablet Take 50 mg by mouth at bedtime.      . vitamin C (ASCORBIC ACID) 250 MG tablet Take 250 mg by mouth daily.      Marland Kitchen zolpidem (AMBIEN) 5 MG tablet Take 5 mg by mouth at bedtime as needed.      . gabapentin (NEURONTIN) 300 MG capsule Take 300 mg by mouth 3 (three) times daily.       No current facility-administered medications on file prior to visit.    ALLERGIES: No Known Allergies  FAMILY HISTORY: Family History  Problem Relation Age of Onset  . Heart disease Neg Hx   . Ataxia Neg Hx   . Chorea Neg Hx   . Dementia Neg Hx   . Mental retardation Neg Hx   . Migraines Neg Hx   . Multiple sclerosis Neg Hx   . Neurofibromatosis Neg Hx   . Neuropathy Neg Hx   . Parkinsonism Neg Hx   . Seizures Neg Hx   . Stroke Neg Hx     SOCIAL HISTORY: History   Social History  . Marital Status: Married    Spouse Name: N/A    Number of Children: N/A  . Years of Education: N/A   Occupational History  . Not on file.   Social History Main Topics  . Smoking status: Former Smoker    Quit date: 08/13/1976  . Smokeless tobacco: Never Used  . Alcohol Use: No  . Drug Use: No  . Sexual Activity: Not on file   Other Topics Concern  . Not on file   Social History  Narrative   Married    REVIEW OF SYSTEMS: Constitutional: No fevers, chills, or sweats, no  generalized fatigue, change in appetite Eyes: No visual changes, double vision, eye pain Ear, nose and throat: No hearing loss, ear pain, nasal congestion, sore throat Cardiovascular: No chest pain, palpitations Respiratory:  No shortness of breath at rest or with exertion, wheezes GastrointestinaI: No nausea, vomiting, diarrhea, abdominal pain, fecal incontinence Genitourinary:  No dysuria, urinary retention or frequency Musculoskeletal:  No neck pain, back pain Integumentary: No rash, pruritus, skin lesions Neurological: as above Psychiatric: No depression, insomnia, anxiety Endocrine: No palpitations, fatigue, diaphoresis, mood swings, change in appetite, change in weight, increased thirst Hematologic/Lymphatic:  No anemia, purpura, petechiae. Allergic/Immunologic: no itchy/runny eyes, nasal congestion, recent allergic reactions, rashes  PHYSICAL EXAM: Filed Vitals:   02/10/13 1032  BP: 130/68  Pulse: 66  Temp: 97.5 F (36.4 C)   General: No acute distress, gaunt, possibly dehydrated given the  Head:  Normocephalic/atraumatic Heart:  Regular rate and rhythm Lungs:  Clear to auscultation bilaterally Back: No paraspinal tenderness Neurological Exam: alert and oriented to person, place, and time. Speech fluent and not dysarthric, language intact.  CN II-XII intact. Muscle strength is 5/5 throughout.  Exhibits mild postural tremor when hands outstretched with worsening intention tremor bilaterally with finger to nose testing.  When he stands up from the wheelchair, he exhibits significant shaking in the legs and unable to walk.  I do not appreciate any spontaneous jerking in the upper extremities.  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 I do not see any evidence of myoclonus this visit, but I do not know what to make of that.  I suspect there is some systemic process going on,  given the significantly elevated sed rate.  I don't think it is contributing to the tremor, however.  PLAN: 1.  We can perform an LP looking, particularly looking for cytology.  I don't have a high suspicion for prion disease, but we could check 14-3-3 as well.  At this point, the family would like to hold off on anything invasive until after evaluation by rheumatology regarding the elevated sed rate.  I think evaluation for a systemic process should be pursued. 2.  Regarding the tremor, this is difficult to treat.  It is not usually well-treated with medications such as primidone.  I do not want to try clonazepam at this time given his advanced age. 3.  Try stopping trazodone, as it does not seem effective.  If he notices worsening sleep hygiene, then he may resume it. 4.  Follow up after visit with rheumatology.  We can discuss possible LP at that time, pending findings and recommendations by rheumatologist.  30 minutes spent with patient and family, over 50% spent counseling and coordinating care.  Shon Millet, DO  CC: Doreen Beam, MD

## 2013-02-10 NOTE — Patient Instructions (Addendum)
The tremors you get when you stand up seem like orthostatic tremors.  Unfortunately, this is very difficult to treat.  We can try a benzodiazepine like Klonopin, but I would be hesitant as it tends to have a lot of side effects in people your age.  You have an elevated sedimentation rate.  This is a nonspecific test which indicates some systemic inflammatory process.  I would like to refer you to a rheumatologist to see if this test value is significant.  I would like to see you in about a month or after seeing the rheumatologist.  At that time, we can consider possibly pursuing a spinal tap to look for any evidence of an inflammatory process as well.  In the meantime, stay off the gabapentin.  Also, try stopping the trazodone.  It is used to help sleeping, but if it seems not effective, then you shouldn't take it.  If you notice worsening of insomnia, then you may restart it.   I will let you know about your appointment with a rheumatologist.

## 2013-02-11 ENCOUNTER — Encounter: Payer: Self-pay | Admitting: Neurology

## 2013-02-13 ENCOUNTER — Other Ambulatory Visit: Payer: Self-pay | Admitting: Neurology

## 2013-02-20 ENCOUNTER — Ambulatory Visit (INDEPENDENT_AMBULATORY_CARE_PROVIDER_SITE_OTHER): Payer: Medicare Other | Admitting: Cardiology

## 2013-02-20 ENCOUNTER — Encounter: Payer: Self-pay | Admitting: Cardiology

## 2013-02-20 VITALS — BP 149/66 | HR 75 | Ht 67.0 in | Wt 132.0 lb

## 2013-02-20 DIAGNOSIS — I10 Essential (primary) hypertension: Secondary | ICD-10-CM

## 2013-02-20 DIAGNOSIS — I6522 Occlusion and stenosis of left carotid artery: Secondary | ICD-10-CM

## 2013-02-20 DIAGNOSIS — I6529 Occlusion and stenosis of unspecified carotid artery: Secondary | ICD-10-CM

## 2013-02-20 DIAGNOSIS — I2589 Other forms of chronic ischemic heart disease: Secondary | ICD-10-CM

## 2013-02-20 DIAGNOSIS — I255 Ischemic cardiomyopathy: Secondary | ICD-10-CM

## 2013-02-20 MED ORDER — DIGOXIN 125 MCG PO TABS: ORAL_TABLET | ORAL | Status: DC

## 2013-02-20 NOTE — Progress Notes (Signed)
Clinical Summary Brett Baldwin is a 79 y.o.male last seen by PA Serpe, this is our first visit together. He was seen for the following medical problems.  1. CAD/ICM  prior CABG in 1995 at [facility] - 08/2011 echo LV with global hypokinesis, LVEF <20%, restrictive diastolic dysfunction - cath 04/2005 showed severe native disease, patent bypasses x 3  - no SOB. Fairly sedentary lifestyle, denies any DOE with his daily activities. No orthopnea, no PND, no LE edema - poor appetite, limiting sodium intake. Takes aleve regularly - compliant with meds   2. HTN - does not check bp regularly. - compliant with meds  3. Carotid stenosis - deemed previously poor candidate for intervention from prior cardiology notes - no new neurological symptoms  4. Failure to thrive - family reports over the last several weeks patient with decreased oral intake, difficulty walking, less conversational, no longer participating in activities with the family. Overall withdrawn - reports he has followed up with Dr Sherril Croon recently regarding this, and also is seeing a neurologist and rheumatologist.    Past Medical History  Diagnosis Date  . Hypertension   . History of pulmonary embolism     No pulmonary embolus on the current CT scan as of 03/01/2009  . Carotid artery stenosis      Status post CT of head and neck. Right common carotid artery and right internal carotid artery were occluded not contiguous string sign detected. Prominent irregular calcified plaque noted in the left carotid artery with an estimated 60% diameter stenosis. Also 75% stenosis in the proximal vertebral artery..  . COPD (chronic obstructive pulmonary disease)   . Pulmonary nodules   . Swallowing difficulty   . CAD (coronary artery disease)     Coronary artery bypass grafting 1995 at [facility].  . Ischemic cardiomyopathy   . Chronic systolic heart failure   . Chronic kidney disease   . Weight loss      No Known  Allergies   Current Outpatient Prescriptions  Medication Sig Dispense Refill  . acyclovir (ZOVIRAX) 200 MG capsule Take 200 mg by mouth 5 (five) times daily.      Marland Kitchen aspirin 325 MG EC tablet Take 325 mg by mouth daily.      . bisoprolol-hydrochlorothiazide (ZIAC) 5-6.25 MG per tablet Take 1 tablet by mouth daily.      . citalopram (CELEXA) 10 MG tablet Take 10 mg by mouth daily.      . digoxin (LANOXIN) 0.125 MG tablet TAKE ONE TABLET BY MOUTH EVERY DAY  30 tablet  6  . eplerenone (INSPRA) 25 MG tablet Take 25 mg by mouth daily.      . fish oil-omega-3 fatty acids 1000 MG capsule Take 2 g by mouth daily.      Marland Kitchen gabapentin (NEURONTIN) 300 MG capsule Take 300 mg by mouth 3 (three) times daily.      . GuaiFENesin (MUCINEX PO) Take 1 tablet by mouth 2 (two) times daily as needed. For cough      . hydrALAZINE (APRESOLINE) 10 MG tablet Take 1 tablet (10 mg total) by mouth 3 (three) times daily.  90 tablet  4  . HYDROcodone-acetaminophen (NORCO/VICODIN) 5-325 MG per tablet Take 1 tablet by mouth every 6 (six) hours as needed for pain.      . mirtazapine (REMERON) 15 MG tablet       . naproxen sodium (ANAPROX) 220 MG tablet Take 220 mg by mouth as needed.      Marland Kitchen  pantoprazole (PROTONIX) 40 MG tablet Take 40 mg by mouth daily.      . simvastatin (ZOCOR) 80 MG tablet Take 80 mg by mouth at bedtime.        . traZODone (DESYREL) 50 MG tablet Take 50 mg by mouth at bedtime.      . vitamin C (ASCORBIC ACID) 250 MG tablet Take 250 mg by mouth daily.      Marland Kitchen zolpidem (AMBIEN) 5 MG tablet Take 5 mg by mouth at bedtime as needed.       No current facility-administered medications for this visit.     Past Surgical History  Procedure Laterality Date  . Cardiac catheterization  2007    revealing patent grafts  . Coronary artery bypass graft  1995  . Prostate surgery      Hx of bypass prostate infections     No Known Allergies    Family History  Problem Relation Age of Onset  . Heart disease Neg  Hx   . Ataxia Neg Hx   . Chorea Neg Hx   . Dementia Neg Hx   . Mental retardation Neg Hx   . Migraines Neg Hx   . Multiple sclerosis Neg Hx   . Neurofibromatosis Neg Hx   . Neuropathy Neg Hx   . Parkinsonism Neg Hx   . Seizures Neg Hx   . Stroke Neg Hx      Social History Brett Baldwin reports that he quit smoking about 36 years ago. He has never used smokeless tobacco. Brett Baldwin reports that he does not drink alcohol.   Review of Systems CONSTITUTIONAL: generalized fatigue HEENT: Eyes: No visual loss, blurred vision, double vision or yellow sclerae.No hearing loss, sneezing, congestion, runny nose or sore throat.  SKIN: No rash or itching.  CARDIOVASCULAR: per HPI RESPIRATORY: per HPI GASTROINTESTINAL: decreased appetite GENITOURINARY: No burning on urination, no polyuria NEUROLOGICAL: diffuse weakness MUSCULOSKELETAL: No muscle, back pain, joint pain or stiffness.  LYMPHATICS: No enlarged nodes. No history of splenectomy.  PSYCHIATRIC: No history of depression or anxiety.  ENDOCRINOLOGIC: No reports of sweating, cold or heat intolerance. No polyuria or polydipsia.  Marland Kitchen   Physical Examination p 75 bp 149/66 Wt 132 lbs BMI 21 Gen: resting comfortably, no acute distress HEENT: no scleral icterus, pupils equal round and reactive, no palptable cervical adenopathy,  CV: RRR, no m/r/g, no JVD, left carotid bruit Resp: Clear to auscultation bilaterally GI: abdomen is soft, non-tender, non-distended, normal bowel sounds, no hepatosplenomegaly MSK: extremities are warm, no edema.  Skin: warm, no rash Neuro:  no focal deficits Psych: appropriate affect   Diagnostic Studies 08/2011 Echo LVEF <20%, LV moderately dilated, global hypokinesis, restrictive diastolic dysfunction, multiple WMAs, mod LAE, mild AS, moderate MR, mild to mod TR, PASP 60  04/2005 Cath PROCEDURES PERFORMED:  1. Selective coronary angiography.  2. Saphenous vein graft angiography.  3. LIMA  angiography.  4. Left heart catheterization.  5. Left ventriculogram done with a hand injection and about 9 cc of dye.  DESCRIPTION OF PROCEDURE: The risks and benefits of catheterization were  explained to Mr. Kuba; consent was placed on the chart. Given his  renal insufficiency, he was pretreated with a bicarbonate drip prior to the  catheterization and efforts were made to minimize contrast exposure. A 4-  [place] arterial sheath was placed in the right femoral artery using modified  Seldinger technique. A JL-4 catheters was used for left coronary system, JR-  4 was used for the right coronary  artery as was the saphenous vein grafts.  We used standard IM catheter exchanged over wire to image the IMA and a  standard bent pigtail was used for the left heart catheterization. All  catheter exchanges made over wire. There were no apparent complications.  Central aortic pressure is 144/66 with a mean of 96. LV pressure is 132/8  with LVEDP of 12. There is no aortic stenosis.  CORONARY ANATOMY: Left main was short and mildly calcified. No significant  atherosclerosis.  LAD was heavily calcified in its proximal portion. There was 40-50% tubular  lesion. The LAD then gave off two very tiny diagonals prior to being totally  occluded in the midsection. The diagonals were small and diffusely diseased.  Left circumflex was previously a dominant vessel. It gave off a very small  OM-1. There was also evidence of an OM-2 and OM-3 which were heavily  diseased. The left circumflex was totally occluded in the mid-AV groove.  Prior to the total occlusion, there was a tandem 90% lesions. There was  ostial 95% lesion in the high small OM-1. Evidence of an OM-2 and OM-3 which  were totally occluded.  The RCA was a small, nondominant vessel.  The saphenous vein graft to the PDA which came off the left circumflex was  widely patent. This back filled the PDA into the distal AV groove circumflex  and also  revealed posterolateral or a low marginal. The distal PDA was  heavily diffusely diseased with a focal 80% stenosis. The distal AV groove  circumflex was also heavily and severely calcified.  The saphenous vein graft sequential to the OM-2 and OM-3 was widely patent.  There was a 40% stenosis in the proximal portion of the graft. The OM-2 was  patent; however, there was diffuse distal disease with 95% lesion very  distally. The overall vessel was 1.5 millimeters. The saphenous vein graft  jump to the OM-3 was widely patent. Once again, OM-3 was small with a 70-80%  focal stenosis in it.  The LIMA to the LAD was widely patent. It filled the LAD well and there was  some moderate disease in the LAD but no obstructive coronary disease. There  was evidence of a distal diagonal Brett Baldwin as well.  Left ventriculogram done the RAO position using a hand injection to minimize  dye showed an EF of about 35% with inferior akinesis.  ASSESSMENT:  1. Severe three-vessel native coronary disease.  2. All three of his bypass grafts were open with severe diffuse distal  disease in the native vessels as well as Brett Baldwin vessel disease.  3. Left ventricular ejection fraction appears to be 35% by hand injection  of the left ventricular. The left ventricular filling pressure is  normal.  PLAN: We will continue with medical therapy as per Dr. Andee Lineman. I would  consider obtaining echocardiogram to further evaluate the ejection fraction.    Assessment and Plan  1. CAD/ICM - no recent chest pain, denies any signiciant DOE. He is euvolemic in clinic today - labs reviewed, most recent GFR 01/2013 was 34. Will discontinue eplrenone in this setting, remain off ACE-I, change digoxin to every other day dosing. Change ASA to 81 mg daily.  - continue other current meds - he is not a candidate for ICD due to age and overall poor functional status  2. Carotid stenosis - no neurological symptoms - per notes not a candidate  for intervention  3. HTN - at goal given his age, continue current mends.  4. Failure to thrive - patient has close follow up with Dr Sherril Croon, as well as neurology. I do no see any cardiac etiology for this.    F/u 6 months   Antoine Poche, M.D., F.A.C.C.

## 2013-02-20 NOTE — Patient Instructions (Signed)
Your physician recommends that you schedule a follow-up appointment in: 6 months with Dr. Wyline Mood. You should receive a letter in the mail in 4 months. If you do not receive this letter by April 2015 call our office to schedule this appointment.   Your physician has recommended you make the following change in your medication:  Decrease Aspirin to 81 MG once daily Stop Eplernone Decrease Digoxin 0.125 MG to one tablet every other day  Continue all other medications the same.

## 2013-03-07 ENCOUNTER — Encounter: Payer: Self-pay | Admitting: Cardiology

## 2013-03-09 ENCOUNTER — Encounter: Payer: Self-pay | Admitting: Cardiology

## 2013-03-10 ENCOUNTER — Telehealth: Payer: Self-pay | Admitting: Neurology

## 2013-03-10 NOTE — Telephone Encounter (Signed)
PATIENT HAD APPT FOR 03-12-13 AND CANCELED TO DUE HIM BEING IN THE HOSPTIAL

## 2013-03-12 ENCOUNTER — Ambulatory Visit: Payer: Medicare Other | Admitting: Neurology

## 2013-04-02 ENCOUNTER — Other Ambulatory Visit: Payer: Self-pay | Admitting: *Deleted

## 2013-04-02 MED ORDER — HYDRALAZINE HCL 10 MG PO TABS: 10.0000 mg | ORAL_TABLET | Freq: Three times a day (TID) | ORAL | Status: DC

## 2013-04-16 ENCOUNTER — Ambulatory Visit: Payer: Medicare Other | Admitting: Cardiology

## 2013-07-29 ENCOUNTER — Other Ambulatory Visit: Payer: Self-pay | Admitting: Cardiology

## 2013-08-19 ENCOUNTER — Encounter: Payer: Self-pay | Admitting: Cardiology

## 2013-08-19 ENCOUNTER — Ambulatory Visit (INDEPENDENT_AMBULATORY_CARE_PROVIDER_SITE_OTHER): Payer: Medicare Other | Admitting: Cardiology

## 2013-08-19 VITALS — BP 118/69 | HR 79 | Ht 67.0 in | Wt 160.0 lb

## 2013-08-19 DIAGNOSIS — I5023 Acute on chronic systolic (congestive) heart failure: Secondary | ICD-10-CM

## 2013-08-19 DIAGNOSIS — I10 Essential (primary) hypertension: Secondary | ICD-10-CM

## 2013-08-19 DIAGNOSIS — I509 Heart failure, unspecified: Secondary | ICD-10-CM

## 2013-08-19 NOTE — Patient Instructions (Signed)
   Lasix 60mg  (1 1/2 tabs of your 40mg  tablet) twice a day  X 3 days only, then resume previous 40mg  twice a day  Continue all other medications.   Follow up in  4-5 weeks

## 2013-08-19 NOTE — Progress Notes (Signed)
Clinical Summary [name] is a 80 y.o.male seen today for follow up of the following medical problems.   1. CAD/ICM  - prior CABG in 1995 at [facility]  - 08/2011 echo LV with global hypokinesis, LVEF <20%, restrictive diastolic dysfunction  - cath 04/2005 showed severe native disease, patent bypasses x 3   - [facility] with decompensated [facility]. The discharge summary is not available at this time Stayed over the weekend. Reports breathing remains the same. DOE at just a few feet for several months. Denies any chest pain. Continues to have abdominal distension.    2. HTN  - does not check bp regularly.  - compliant with meds   3. Carotid stenosis  - deemed previously poor candidate for intervention from prior cardiology notes  - no new neurological symptoms       Past Medical History  Diagnosis Date  . Hypertension   . History of pulmonary embolism     No pulmonary embolus on the current CT scan as of 03/01/2009  . Carotid artery stenosis      Status post CT of head and neck. Right common carotid artery and right internal carotid artery were occluded not contiguous string sign detected. Prominent irregular calcified plaque noted in the left carotid artery with an estimated 60% diameter stenosis. Also 75% stenosis in the proximal vertebral artery..  . COPD (chronic obstructive pulmonary disease)   . Pulmonary nodules   . Swallowing difficulty   . CAD (coronary artery disease)     [facility].  . Ischemic cardiomyopathy   . Chronic systolic heart failure   . Chronic kidney disease   . Weight loss      No Known Allergies   Current Outpatient Prescriptions  Medication Sig Dispense Refill  . acyclovir (ZOVIRAX) 200 MG capsule Take 200 mg by mouth 5 (five) times daily.      Marland Kitchen. aspirin 81 MG tablet Take 81 mg by mouth daily.      . bisoprolol-hydrochlorothiazide (ZIAC) 5-6.25 MG per tablet  Take 1 tablet by mouth daily.      . citalopram (CELEXA) 10 MG tablet Take 10 mg by mouth daily.      . Cyanocobalamin (B-12 PO) Take 1 tablet by mouth daily.      . digoxin (LANOXIN) 0.125 MG tablet TAKE 1 TABLET BY MOUTH EVERY OTHER DAY  15 tablet  6  . fish oil-omega-3 fatty acids 1000 MG capsule Take 2 g by mouth daily.      . GuaiFENesin (MUCINEX PO) Take 1 tablet by mouth 2 (two) times daily as needed. For cough      . hydrALAZINE (APRESOLINE) 10 MG tablet Take 1 tablet (10 mg total) by mouth 3 (three) times daily.  90 tablet  4  . HYDROcodone-acetaminophen (NORCO/VICODIN) 5-325 MG per tablet Take 1 tablet by mouth every 6 (six) hours as needed for pain.      . mirtazapine (REMERON) 15 MG tablet Take 7.5 mg by mouth at bedtime.       . Multiple Vitamin (MULTIVITAMIN) tablet Take 1 tablet by mouth 2 (two) times daily.      . naproxen sodium (ANAPROX) 220 MG tablet Take 220 mg by mouth as needed.      . pantoprazole (PROTONIX) 40 MG tablet Take 40 mg by mouth daily.      . simvastatin (ZOCOR) 80 MG tablet Take 80 mg by mouth daily.      .Marland Kitchen  vitamin C (ASCORBIC ACID) 250 MG tablet Take 250 mg by mouth daily.      Marland Kitchen zolpidem (AMBIEN) 5 MG tablet Take 5 mg by mouth at bedtime as needed.       No current facility-administered medications for this visit.     Past Surgical History  Procedure Laterality Date  . Cardiac catheterization  2007    revealing patent grafts  . Coronary artery bypass graft  1995  . Prostate surgery      Hx of bypass prostate infections     No Known Allergies    Family History  Problem Relation Age of Onset  . Heart disease Neg Hx   . Ataxia Neg Hx   . Chorea Neg Hx   . Dementia Neg Hx   . Mental retardation Neg Hx   . Migraines Neg Hx   . Multiple sclerosis Neg Hx   . Neurofibromatosis Neg Hx   . Neuropathy Neg Hx   . Parkinsonism Neg Hx   . Seizures Neg Hx   . Stroke Neg Hx      Social History Brett Baldwin reports that he quit smoking about  37 years ago. He has never used smokeless tobacco. Brett Baldwin reports that he does not drink alcohol.   Review of Systems CONSTITUTIONAL: No weight loss, fever, chills, weakness or fatigue.  HEENT: Eyes: No visual loss, blurred vision, double vision or yellow sclerae.No hearing loss, sneezing, congestion, runny nose or sore throat.  SKIN: No rash or itching.  CARDIOVASCULAR: per HPI RESPIRATORY: + cough productive  GASTROINTESTINAL: No anorexia, nausea, vomiting or diarrhea. No abdominal pain or blood.  GENITOURINARY: No burning on urination, no polyuria NEUROLOGICAL: No headache, dizziness, syncope, paralysis, ataxia, numbness or tingling in the extremities. No change in bowel or bladder control.  MUSCULOSKELETAL: No muscle, back pain, joint pain or stiffness.  LYMPHATICS: No enlarged nodes. No history of splenectomy.  PSYCHIATRIC: No history of depression or anxiety.  ENDOCRINOLOGIC: No reports of sweating, cold or heat intolerance. No polyuria or polydipsia.  Marland Kitchen   Physical Examination p 79 bp 118/69 Wt 160 lbs BMI 25 Gen: resting comfortably, no acute distress HEENT: no scleral icterus, pupils equal round and reactive, no palptable cervical adenopathy,  CV: RRR, no m/r/g,no JVD Resp: faint crackles bilateral bases GI: abdomen is soft, non-tender, non-distended, normal bowel sounds, no hepatosplenomegaly MSK: extremities are warm, no edema.  Skin: warm, no rash Neuro:  no focal deficits Psych: appropriate affect   Diagnostic Studies  08/2011 Echo  LVEF <20%, LV moderately dilated, global hypokinesis, restrictive diastolic dysfunction, multiple WMAs, mod LAE, mild AS, moderate MR, mild to mod TR, PASP 60   04/2005 Cath  PROCEDURES PERFORMED:  1. Selective coronary angiography.  2. Saphenous vein graft angiography.  3. LIMA angiography.  4. Left heart catheterization.  5. Left ventriculogram done with a hand injection and about 9 cc of dye.  DESCRIPTION OF PROCEDURE:  The risks and benefits of catheterization were  explained to Brett Baldwin; consent was placed on the chart. Given his  renal insufficiency, he was pretreated with a bicarbonate drip prior to the  catheterization and efforts were made to minimize contrast exposure. A 4-  [place] arterial sheath was placed in the right femoral artery using modified  Seldinger technique. A JL-4 catheters was used for left coronary system, JR-  4 was used for the right coronary artery as was the saphenous vein grafts.  We used standard IM catheter exchanged over wire to image  the IMA and a  standard bent pigtail was used for the left heart catheterization. All  catheter exchanges made over wire. There were no apparent complications.  Central aortic pressure is 144/66 with a mean of 96. LV pressure is 132/8  with LVEDP of 12. There is no aortic stenosis.  CORONARY ANATOMY: Left main was short and mildly calcified. No significant  atherosclerosis.  LAD was heavily calcified in its proximal portion. There was 40-50% tubular  lesion. The LAD then gave off two very tiny diagonals prior to being totally  occluded in the midsection. The diagonals were small and diffusely diseased.  Left circumflex was previously a dominant vessel. It gave off a very small  OM-1. There was also evidence of an OM-2 and OM-3 which were heavily  diseased. The left circumflex was totally occluded in the mid-AV groove.  Prior to the total occlusion, there was a tandem 90% lesions. There was  ostial 95% lesion in the high small OM-1. Evidence of an OM-2 and OM-3 which  were totally occluded.  The RCA was a small, nondominant vessel.  The saphenous vein graft to the PDA which came off the left circumflex was  widely patent. This back filled the PDA into the distal AV groove circumflex  and also revealed posterolateral or a low marginal. The distal PDA was  heavily diffusely diseased with a focal 80% stenosis. The distal AV groove    circumflex was also heavily and severely calcified.  The saphenous vein graft sequential to the OM-2 and OM-3 was widely patent.  There was a 40% stenosis in the proximal portion of the graft. The OM-2 was  patent; however, there was diffuse distal disease with 95% lesion very  distally. The overall vessel was 1.5 millimeters. The saphenous vein graft  jump to the OM-3 was widely patent. Once again, OM-3 was small with a 70-80%  focal stenosis in it.  The LIMA to the LAD was widely patent. It filled the LAD well and there was  some moderate disease in the LAD but no obstructive coronary disease. There  was evidence of a distal diagonal branch as well.  Left ventriculogram done the RAO position using a hand injection to minimize  dye showed an EF of about 35% with inferior akinesis.  ASSESSMENT:  1. Severe three-vessel native coronary disease.  2. All three of his bypass grafts were open with severe diffuse distal  disease in the native vessels as well as branch vessel disease.  3. Left ventricular ejection fraction appears to be 35% by hand injection  of the left ventricular. The left ventricular filling pressure is  normal.  PLAN: We will continue with medical therapy as per Dr. Andee Lineman. I would  consider obtaining echocardiogram to further evaluate the ejection fraction.    Assessment and Plan  1. CAD/ICM - recent admission to [facility] with volume overload, the discharge summary is not available at this time - still with some SOB and evidence of volume overload, will increase lasix to 60mg  bid for 3 days then resume 40mg  bid.   2. Carotid stenosis  - no neurological symptoms  - per notes not a candidate for intervention   3. HTN  - at goal given his age, continue current mends.      F/u 1 month    Antoine Poche, M.D., F.A.C.C.

## 2013-08-26 ENCOUNTER — Other Ambulatory Visit: Payer: Self-pay | Admitting: Cardiology

## 2013-09-03 ENCOUNTER — Encounter: Payer: Self-pay | Admitting: Cardiology

## 2013-09-15 ENCOUNTER — Encounter: Payer: Medicare Other | Admitting: Cardiology

## 2013-09-15 ENCOUNTER — Encounter: Payer: Self-pay | Admitting: Cardiology

## 2013-09-15 NOTE — Progress Notes (Signed)
No Show

## 2013-09-19 ENCOUNTER — Encounter: Payer: Self-pay | Admitting: Cardiology

## 2013-10-23 ENCOUNTER — Other Ambulatory Visit: Payer: Self-pay | Admitting: Cardiology

## 2013-11-27 ENCOUNTER — Ambulatory Visit (INDEPENDENT_AMBULATORY_CARE_PROVIDER_SITE_OTHER): Payer: Medicare Other | Admitting: Cardiology

## 2013-11-27 ENCOUNTER — Encounter: Payer: Self-pay | Admitting: Cardiology

## 2013-11-27 VITALS — BP 112/57 | HR 86 | Ht 66.0 in | Wt 177.0 lb

## 2013-11-27 DIAGNOSIS — I5023 Acute on chronic systolic (congestive) heart failure: Secondary | ICD-10-CM

## 2013-11-27 DIAGNOSIS — Z79899 Other long term (current) drug therapy: Secondary | ICD-10-CM

## 2013-11-27 DIAGNOSIS — N189 Chronic kidney disease, unspecified: Secondary | ICD-10-CM

## 2013-11-27 MED ORDER — TORSEMIDE 20 MG PO TABS: ORAL_TABLET | ORAL | Status: DC

## 2013-11-27 NOTE — Progress Notes (Signed)
Clinical Summary Brett Baldwin is a 80 y.o.male seen today for follow up of the following medical problems. This is a focused visit on his history systolic heart failure.   1. CAD/ICM/Acute on chronic systolic heart failure - prior CABG in 1995 at [facility]  - 08/2011 echo LV with global hypokinesis, LVEF <20%, restrictive diastolic dysfunction  - cath 04/2005 showed severe native disease, patent bypasses x 3  - increased DOE since last visit, occurs walking room to room at house. Increased LE edema, no orthopnea. Reports 20 lbs weight gain over the last few months - compliant with meds, limiting sodium intake. Does report orthostatic dizziness  2. Nose bleeds - heavy nose bleeds intermittenly, approx every 2 weeks.   Past Medical History  Diagnosis Date  . Hypertension   . History of pulmonary embolism     No pulmonary embolus on the current CT scan as of 03/01/2009  . Carotid artery stenosis      Status post CT of head and neck. Right common carotid artery and right internal carotid artery were occluded not contiguous string sign detected. Prominent irregular calcified plaque noted in the left carotid artery with an estimated 60% diameter stenosis. Also 75% stenosis in the proximal vertebral artery..  . COPD (chronic obstructive pulmonary disease)   . Pulmonary nodules   . Swallowing difficulty   . CAD (coronary artery disease)     Coronary artery bypass grafting 1995 at [facility].  . Ischemic cardiomyopathy   . Chronic systolic heart failure   . Chronic kidney disease   . Weight loss      No Known Allergies   Current Outpatient Prescriptions  Medication Sig Dispense Refill  . aspirin 81 MG tablet Take 81 mg by mouth daily.      . bisoprolol-hydrochlorothiazide (ZIAC) 5-6.25 MG per tablet Take 1 tablet by mouth daily.      . citalopram (CELEXA) 10 MG tablet Take 10 mg by mouth daily.      . Cyanocobalamin (B-12 PO) Take 1 tablet by mouth daily.      .  digoxin (LANOXIN) 0.125 MG tablet TAKE 1 TABLET BY MOUTH EVERY OTHER DAY  15 tablet  3  . fluticasone (FLONASE) 50 MCG/ACT nasal spray Place 2 sprays into both nostrils daily.      . furosemide (LASIX) 40 MG tablet Take 1 tablet by mouth 2 (two) times daily.      . GuaiFENesin (MUCINEX PO) Take 1 tablet by mouth 2 (two) times daily as needed. For cough      . hydrALAZINE (APRESOLINE) 10 MG tablet TAKE 1 TABLET BY MOUTH THREE TIMES DAILY  90 tablet  4  . HYDROcodone-acetaminophen (NORCO/VICODIN) 5-325 MG per tablet Take 1 tablet by mouth every 6 (six) hours as needed.       . mirtazapine (REMERON) 15 MG tablet Take 15 mg by mouth at bedtime.       . Multiple Vitamin (MULTIVITAMIN) tablet Take 1 tablet by mouth 2 (two) times daily.      . naproxen sodium (ANAPROX) 220 MG tablet Take 220 mg by mouth as needed.      . pantoprazole (PROTONIX) 40 MG tablet Take 40 mg by mouth daily.      . predniSONE (DELTASONE) 5 MG tablet Take 10 mg by mouth daily.      . simvastatin (ZOCOR) 40 MG tablet Take 1 tablet by mouth daily.      . traZODone (DESYREL) 100 MG tablet  Take 1 tablet by mouth daily.      . VENTOLIN HFA 108 (90 BASE) MCG/ACT inhaler Inhale 2 puffs into the lungs every 4 (four) hours as needed.      . vitamin C (ASCORBIC ACID) 250 MG tablet Take 250 mg by mouth daily.       No current facility-administered medications for this visit.     Past Surgical History  Procedure Laterality Date  . Cardiac catheterization  2007    revealing patent grafts  . Coronary artery bypass graft  1995  . Prostate surgery      Hx of bypass prostate infections     No Known Allergies    Family History  Problem Relation Age of Onset  . Heart disease Neg Hx   . Ataxia Neg Hx   . Chorea Neg Hx   . Dementia Neg Hx   . Mental retardation Neg Hx   . Migraines Neg Hx   . Multiple sclerosis Neg Hx   . Neurofibromatosis Neg Hx   . Neuropathy Neg Hx   . Parkinsonism Neg Hx   . Seizures Neg Hx   . Stroke  Neg Hx      Social History Brett Baldwin reports that he quit smoking about 37 years ago. He has never used smokeless tobacco. Brett Baldwin reports that he does not drink alcohol.   Review of Systems CONSTITUTIONAL: No weight loss, fever, chills, weakness or fatigue.  HEENT: Eyes: No visual loss, blurred vision, double vision or yellow sclerae.No hearing loss, sneezing, congestion, runny nose or sore throat.  SKIN: No rash or itching.  CARDIOVASCULAR: per HPI RESPIRATORY: + SOB GASTROINTESTINAL: No anorexia, nausea, vomiting or diarrhea. No abdominal pain or blood.  GENITOURINARY: No burning on urination, no polyuria NEUROLOGICAL: No headache, dizziness, syncope, paralysis, ataxia, numbness or tingling in the extremities. No change in bowel or bladder control.  MUSCULOSKELETAL: No muscle, back pain, joint pain or stiffness.  LYMPHATICS: No enlarged nodes. No history of splenectomy.  PSYCHIATRIC: No history of depression or anxiety.  ENDOCRINOLOGIC: No reports of sweating, cold or heat intolerance. No polyuria or polydipsia.  Marland Kitchen   Physical Examination p 86 bp 112/57 Wt 177 lbs BMI 29 Gen: resting comfortably, no acute distress HEENT: no scleral icterus, pupils equal round and reactive, no palptable cervical adenopathy,  CV: RRR, no m/r/g, no JVD Resp: bilateral crackles GI: abdomen is soft, non-tender, non-distended, normal bowel sounds, no hepatosplenomegaly MSK: extremities are warm, no edema.  Skin: warm, no rash Neuro:  no focal deficits Psych: appropriate affect   Diagnostic Studies 08/2011 Echo  LVEF <20%, LV moderately dilated, global hypokinesis, restrictive diastolic dysfunction, multiple WMAs, mod LAE, mild AS, moderate MR, mild to mod TR, PASP 60   04/2005 Cath  PROCEDURES PERFORMED:  1. Selective coronary angiography.  2. Saphenous vein graft angiography.  3. LIMA angiography.  4. Left heart catheterization.  5. Left ventriculogram done with a hand  injection and about 9 cc of dye.  DESCRIPTION OF PROCEDURE: The risks and benefits of catheterization were  explained to Brett Baldwin; consent was placed on the chart. Given his  renal insufficiency, he was pretreated with a bicarbonate drip prior to the  catheterization and efforts were made to minimize contrast exposure. A 4-  [place] arterial sheath was placed in the right femoral artery using modified  Seldinger technique. A JL-4 catheters was used for left coronary system, JR-  4 was used for the right coronary artery as was the saphenous  vein grafts.  We used standard IM catheter exchanged over wire to image the IMA and a  standard bent pigtail was used for the left heart catheterization. All  catheter exchanges made over wire. There were no apparent complications.  Central aortic pressure is 144/66 with a mean of 96. LV pressure is 132/8  with LVEDP of 12. There is no aortic stenosis.  CORONARY ANATOMY: Left main was short and mildly calcified. No significant  atherosclerosis.  LAD was heavily calcified in its proximal portion. There was 40-50% tubular  lesion. The LAD then gave off two very tiny diagonals prior to being totally  occluded in the midsection. The diagonals were small and diffusely diseased.  Left circumflex was previously a dominant vessel. It gave off a very small  OM-1. There was also evidence of an OM-2 and OM-3 which were heavily  diseased. The left circumflex was totally occluded in the mid-AV groove.  Prior to the total occlusion, there was a tandem 90% lesions. There was  ostial 95% lesion in the high small OM-1. Evidence of an OM-2 and OM-3 which  were totally occluded.  The RCA was a small, nondominant vessel.  The saphenous vein graft to the PDA which came off the left circumflex was  widely patent. This back filled the PDA into the distal AV groove circumflex  and also revealed posterolateral or a low marginal. The distal PDA was  heavily diffusely  diseased with a focal 80% stenosis. The distal AV groove  circumflex was also heavily and severely calcified.  The saphenous vein graft sequential to the OM-2 and OM-3 was widely patent.  There was a 40% stenosis in the proximal portion of the graft. The OM-2 was  patent; however, there was diffuse distal disease with 95% lesion very  distally. The overall vessel was 1.5 millimeters. The saphenous vein graft  jump to the OM-3 was widely patent. Once again, OM-3 was small with a 70-80%  focal stenosis in it.  The LIMA to the LAD was widely patent. It filled the LAD well and there was  some moderate disease in the LAD but no obstructive coronary disease. There  was evidence of a distal diagonal Austine Wiedeman as well.  Left ventriculogram done the RAO position using a hand injection to minimize  dye showed an EF of about 35% with inferior akinesis.  ASSESSMENT:  1. Severe three-vessel native coronary disease.  2. All three of his bypass grafts were open with severe diffuse distal  disease in the native vessels as well as Amerika Nourse vessel disease.  3. Left ventricular ejection fraction appears to be 35% by hand injection  of the left ventricular. The left ventricular filling pressure is  normal.  PLAN: We will continue with medical therapy as per Dr. Andee Lineman. I would  consider obtaining echocardiogram to further evaluate the ejection fraction.        Assessment and Plan  1. CAD/ICM/Acute on chronic systolic heart failure - worsening DOE with weight gain, evidence of fluid overload on exam - stop lasix, start torsemide  bid x 3 days, then  bid.  - will stop hydralazine due to orthostatic symptoms - check BMET in 1 week and Mg level  2. Nose bleeds - hold ASA  3. CKD - check BMET in 1 week   F/u 2-3 weeks   Antoine Poche, M.D

## 2013-11-27 NOTE — Patient Instructions (Signed)
Your physician has recommended you make the following change in your medication:  STOP Hydralazine, Lasix, & Aspirin.  Begin Torsemide  twice a day x 3 days, then decrease to  twice a day thereafter Continue all other medications.   Labs for BMET, Magnesium, & CBC Office will contact with results via phone or letter.   Please bring all pill bottles to your next office visit. Follow up in  2 weeks

## 2013-12-01 ENCOUNTER — Telehealth: Payer: Self-pay | Admitting: *Deleted

## 2013-12-01 MED ORDER — TORSEMIDE 20 MG PO TABS: 40.0000 mg | ORAL_TABLET | Freq: Two times a day (BID) | ORAL | Status: DC

## 2013-12-01 NOTE — Telephone Encounter (Signed)
States home health nurse advised her to call - did not think he was losing enough weight based on medicine changes.  Last seen 11/27/2013, finished the Torsemide  twice a day x 3 days & started the  twice a day this morning.  His weight has remained 173 - 174 at home.  His weight in office was 177, but wife states there is always that difference between their home scales & office scales.  Does note that he has been urinating more, but no other changes in breathing or edema in his feet.  Due to do his lab work this Thursday & will see you back 12/16/2013.

## 2013-12-01 NOTE — Telephone Encounter (Signed)
Wife notified.

## 2013-12-01 NOTE — Telephone Encounter (Signed)
Lets have him take torsemide  bid until follow up. Keep labs for this week.   Dominga Ferry MD

## 2013-12-09 ENCOUNTER — Telehealth: Payer: Self-pay | Admitting: *Deleted

## 2013-12-09 ENCOUNTER — Encounter: Payer: Self-pay | Admitting: Cardiology

## 2013-12-09 NOTE — Telephone Encounter (Signed)
Message copied by Lesle ChrisHILL, Arlicia Paquette G on Tue Dec 09, 2013  3:38 PM ------      Message from: Dina RichBRANCH, JONATHAN F      Created: Tue Dec 09, 2013  9:46 AM       Labs show stable kidney function. His potassium has decreased on the strong diuretic, from my records do not see he is on KCl at home, please confirm. If not on start KCl 20 mEq daily. Repeat BMET in 1 month                  Dominga FerryJ Branch MD ------

## 2013-12-10 ENCOUNTER — Inpatient Hospital Stay (HOSPITAL_COMMUNITY)
Admission: EM | Admit: 2013-12-10 | Discharge: 2013-12-17 | DRG: 292 | Disposition: A | Discharge status: Home Health | Payer: Medicare Other | Source: Other Acute Inpatient Hospital | Attending: Cardiovascular Disease | Admitting: Cardiovascular Disease

## 2013-12-10 ENCOUNTER — Observation Stay (HOSPITAL_COMMUNITY): Payer: Medicare Other

## 2013-12-10 DIAGNOSIS — J441 Chronic obstructive pulmonary disease with (acute) exacerbation: Secondary | ICD-10-CM | POA: Diagnosis present

## 2013-12-10 DIAGNOSIS — R0789 Other chest pain: Secondary | ICD-10-CM

## 2013-12-10 DIAGNOSIS — Z86711 Personal history of pulmonary embolism: Secondary | ICD-10-CM

## 2013-12-10 DIAGNOSIS — I5043 Acute on chronic combined systolic (congestive) and diastolic (congestive) heart failure: Secondary | ICD-10-CM | POA: Diagnosis not present

## 2013-12-10 DIAGNOSIS — N189 Chronic kidney disease, unspecified: Secondary | ICD-10-CM | POA: Diagnosis not present

## 2013-12-10 DIAGNOSIS — E785 Hyperlipidemia, unspecified: Secondary | ICD-10-CM | POA: Diagnosis present

## 2013-12-10 DIAGNOSIS — I129 Hypertensive chronic kidney disease with stage 1 through stage 4 chronic kidney disease, or unspecified chronic kidney disease: Secondary | ICD-10-CM | POA: Diagnosis not present

## 2013-12-10 DIAGNOSIS — Z951 Presence of aortocoronary bypass graft: Secondary | ICD-10-CM

## 2013-12-10 DIAGNOSIS — Z87891 Personal history of nicotine dependence: Secondary | ICD-10-CM | POA: Diagnosis not present

## 2013-12-10 DIAGNOSIS — I255 Ischemic cardiomyopathy: Secondary | ICD-10-CM | POA: Diagnosis present

## 2013-12-10 DIAGNOSIS — E876 Hypokalemia: Secondary | ICD-10-CM | POA: Diagnosis present

## 2013-12-10 DIAGNOSIS — I5041 Acute combined systolic (congestive) and diastolic (congestive) heart failure: Secondary | ICD-10-CM

## 2013-12-10 DIAGNOSIS — N184 Chronic kidney disease, stage 4 (severe): Secondary | ICD-10-CM | POA: Diagnosis present

## 2013-12-10 DIAGNOSIS — K219 Gastro-esophageal reflux disease without esophagitis: Secondary | ICD-10-CM | POA: Diagnosis present

## 2013-12-10 DIAGNOSIS — IMO0002 Reserved for concepts with insufficient information to code with codable children: Secondary | ICD-10-CM | POA: Diagnosis not present

## 2013-12-10 DIAGNOSIS — R0602 Shortness of breath: Secondary | ICD-10-CM

## 2013-12-10 DIAGNOSIS — I2510 Atherosclerotic heart disease of native coronary artery without angina pectoris: Secondary | ICD-10-CM | POA: Diagnosis present

## 2013-12-10 DIAGNOSIS — I2589 Other forms of chronic ischemic heart disease: Secondary | ICD-10-CM | POA: Diagnosis not present

## 2013-12-10 DIAGNOSIS — Z7952 Long term (current) use of systemic steroids: Secondary | ICD-10-CM

## 2013-12-10 DIAGNOSIS — Z79899 Other long term (current) drug therapy: Secondary | ICD-10-CM | POA: Diagnosis not present

## 2013-12-10 DIAGNOSIS — I5023 Acute on chronic systolic (congestive) heart failure: Secondary | ICD-10-CM

## 2013-12-10 DIAGNOSIS — J449 Chronic obstructive pulmonary disease, unspecified: Secondary | ICD-10-CM

## 2013-12-10 DIAGNOSIS — R079 Chest pain, unspecified: Secondary | ICD-10-CM | POA: Insufficient documentation

## 2013-12-10 DIAGNOSIS — I509 Heart failure, unspecified: Secondary | ICD-10-CM

## 2013-12-10 DIAGNOSIS — I10 Essential (primary) hypertension: Secondary | ICD-10-CM

## 2013-12-10 HISTORY — DX: Chronic kidney disease, stage 4 (severe): N18.4

## 2013-12-10 MED ORDER — SIMVASTATIN 40 MG PO TABS
40.0000 mg | ORAL_TABLET | Freq: Every day | ORAL | Status: DC
  Administered 2013-12-10 – 2013-12-12 (×3): 40 mg via ORAL
  Filled 2013-12-10 (×3): qty 1

## 2013-12-10 MED ORDER — VITAMIN D3 25 MCG (1000 UNIT) PO TABS
1000.0000 [IU] | ORAL_TABLET | Freq: Every day | ORAL | Status: DC
  Administered 2013-12-11 – 2013-12-17 (×7): 1000 [IU] via ORAL
  Filled 2013-12-10 (×7): qty 1

## 2013-12-10 MED ORDER — FUROSEMIDE 10 MG/ML IJ SOLN
80.0000 mg | Freq: Once | INTRAMUSCULAR | Status: DC
  Filled 2013-12-10: qty 8

## 2013-12-10 MED ORDER — HYDROCODONE-ACETAMINOPHEN 5-325 MG PO TABS
1.0000 | ORAL_TABLET | Freq: Four times a day (QID) | ORAL | Status: DC | PRN
  Administered 2013-12-16: 1 via ORAL
  Filled 2013-12-10: qty 1

## 2013-12-10 MED ORDER — FUROSEMIDE 10 MG/ML IJ SOLN
80.0000 mg | Freq: Every day | INTRAMUSCULAR | Status: DC
  Administered 2013-12-11: 80 mg via INTRAVENOUS
  Filled 2013-12-10: qty 8

## 2013-12-10 MED ORDER — GUAIFENESIN ER 600 MG PO TB12
600.0000 mg | ORAL_TABLET | Freq: Two times a day (BID) | ORAL | Status: DC | PRN
Start: 2013-12-10 — End: 2013-12-17
  Administered 2013-12-11 – 2013-12-16 (×6): 600 mg via ORAL
  Filled 2013-12-10 (×7): qty 1

## 2013-12-10 MED ORDER — MIRTAZAPINE 15 MG PO TABS
15.0000 mg | ORAL_TABLET | Freq: Every evening | ORAL | Status: DC | PRN
  Administered 2013-12-13 – 2013-12-14 (×2): 15 mg via ORAL
  Filled 2013-12-10 (×3): qty 1

## 2013-12-10 MED ORDER — CITALOPRAM HYDROBROMIDE 10 MG PO TABS
10.0000 mg | ORAL_TABLET | Freq: Every day | ORAL | Status: DC
  Administered 2013-12-11 – 2013-12-17 (×7): 10 mg via ORAL
  Filled 2013-12-10 (×8): qty 1

## 2013-12-10 MED ORDER — ACETAMINOPHEN 325 MG PO TABS
650.0000 mg | ORAL_TABLET | ORAL | Status: DC | PRN
  Administered 2013-12-11 (×2): 650 mg via ORAL
  Filled 2013-12-10 (×2): qty 2

## 2013-12-10 MED ORDER — ONDANSETRON HCL 4 MG/2ML IJ SOLN: 4.0000 mg | Freq: Four times a day (QID) | INTRAMUSCULAR | Status: DC | PRN

## 2013-12-10 MED ORDER — PREDNISONE 10 MG PO TABS
10.0000 mg | ORAL_TABLET | Freq: Every day | ORAL | Status: DC
  Administered 2013-12-11 – 2013-12-17 (×7): 10 mg via ORAL
  Filled 2013-12-10 (×8): qty 1

## 2013-12-10 MED ORDER — ADULT MULTIVITAMIN W/MINERALS CH
1.0000 | ORAL_TABLET | Freq: Two times a day (BID) | ORAL | Status: DC
  Administered 2013-12-10 – 2013-12-17 (×14): 1 via ORAL
  Filled 2013-12-10 (×15): qty 1

## 2013-12-10 MED ORDER — VITAMIN C 250 MG PO TABS
250.0000 mg | ORAL_TABLET | Freq: Every day | ORAL | Status: DC
  Administered 2013-12-11 – 2013-12-16 (×6): 250 mg via ORAL
  Filled 2013-12-10 (×7): qty 1

## 2013-12-10 MED ORDER — PANTOPRAZOLE SODIUM 40 MG PO TBEC
40.0000 mg | DELAYED_RELEASE_TABLET | Freq: Every day | ORAL | Status: DC
  Administered 2013-12-11 – 2013-12-17 (×7): 40 mg via ORAL
  Filled 2013-12-10 (×7): qty 1

## 2013-12-10 MED ORDER — FLUTICASONE PROPIONATE 50 MCG/ACT NA SUSP
2.0000 | Freq: Every day | NASAL | Status: DC
  Administered 2013-12-11 – 2013-12-16 (×6): 2 via NASAL
  Filled 2013-12-10 (×2): qty 16

## 2013-12-10 MED ORDER — ALBUTEROL SULFATE (2.5 MG/3ML) 0.083% IN NEBU
3.0000 mL | INHALATION_SOLUTION | RESPIRATORY_TRACT | Status: DC | PRN
  Administered 2013-12-11 – 2013-12-13 (×2): 3 mL via RESPIRATORY_TRACT
  Filled 2013-12-10 (×2): qty 3

## 2013-12-10 MED ORDER — BISOPROLOL-HYDROCHLOROTHIAZIDE 5-6.25 MG PO TABS
1.0000 | ORAL_TABLET | Freq: Every day | ORAL | Status: DC
  Administered 2013-12-11 – 2013-12-13 (×3): 1 via ORAL
  Filled 2013-12-10 (×4): qty 1

## 2013-12-10 MED ORDER — ASPIRIN EC 81 MG PO TBEC
81.0000 mg | DELAYED_RELEASE_TABLET | Freq: Every day | ORAL | Status: DC
  Administered 2013-12-11 – 2013-12-17 (×7): 81 mg via ORAL
  Filled 2013-12-10 (×7): qty 1

## 2013-12-10 MED ORDER — HEPARIN (PORCINE) IN NACL 100-0.45 UNIT/ML-% IJ SOLN
1100.0000 [IU]/h | INTRAMUSCULAR | Status: DC
  Administered 2013-12-11: 1100 [IU]/h via INTRAVENOUS
  Administered 2013-12-11: 1250 [IU]/h via INTRAVENOUS
  Filled 2013-12-10 (×2): qty 250

## 2013-12-10 NOTE — H&P (Signed)
Brett Baldwin is an 80 y.o. male.     Chief Complaint: chest pain, transfer from [facility] Primary Cardiologist: Seen at [facility], has seen dr. Harl Bowie several times HPI: Brett Baldwin is an 80 yo man with PMH of CAD and CABG '95 @ [facility] with last known EF <20%, restrictive diastolic dysfunction who presented to [facility] with dyspnea/shortness of breath, cough and chest discomfort since this AM at 09:00. He characterizes the chest discomfort as throbbing and as severe as 8/10 currently 4-5/10 and overall he feels much worse. His wife had a cough for the last 2-3 days. He received aspirin 324 mg (4 pills) and was eventually started on heparin for an elevated troponin and transferred to [facility]. He has had some nosebleeds recently and aspirin was stopped. He also endorses 20 lbs fo gradual weight gain over teh last several months.    Past Medical History  Diagnosis Date  . Hypertension   . History of pulmonary embolism     No pulmonary embolus on the current CT scan as of 03/01/2009  . Carotid artery stenosis      Status post CT of head and neck. Right common carotid artery and right internal carotid artery were occluded not contiguous string sign detected. Prominent irregular calcified plaque noted in the left carotid artery with an estimated 60% diameter stenosis. Also 75% stenosis in the proximal vertebral artery..  . COPD (chronic obstructive pulmonary disease)   . Pulmonary nodules   . Swallowing difficulty   . CAD (coronary artery disease)     Coronary artery bypass grafting 1995 at [facility].  . Ischemic cardiomyopathy   . Chronic systolic heart failure   . Chronic kidney disease   . Weight loss     Past Surgical History  Procedure Laterality Date  . Cardiac catheterization  2007    revealing patent grafts  . Coronary artery bypass graft  1995  . Prostate surgery      Hx of bypass prostate infections    Family History  Problem Relation  Age of Onset  . Heart disease Neg Hx   . Ataxia Neg Hx   . Chorea Neg Hx   . Dementia Neg Hx   . Mental retardation Neg Hx   . Migraines Neg Hx   . Multiple sclerosis Neg Hx   . Neurofibromatosis Neg Hx   . Neuropathy Neg Hx   . Parkinsonism Neg Hx   . Seizures Neg Hx   . Stroke Neg Hx    Social History:  reports that he quit smoking about 37 years ago. His smoking use included Cigarettes. He started smoking about 72 years ago. He has a 35 pack-year smoking history. He has never used smokeless tobacco. He reports that he does not drink alcohol or use illicit drugs.  Allergies: No Known Allergies  Medications Prior to Admission  Medication Sig Dispense Refill  . bisoprolol-hydrochlorothiazide (ZIAC) 5-6.25 MG per tablet Take 1 tablet by mouth daily.      . cholecalciferol (VITAMIN D) 1000 UNITS tablet Take 1,000 Units by mouth daily.      . citalopram (CELEXA) 10 MG tablet Take 10 mg by mouth daily.      . Cyanocobalamin (B-12 PO) Take 1 tablet by mouth daily.      . fluticasone (FLONASE) 50 MCG/ACT nasal spray Place 2 sprays into both nostrils daily.      . GuaiFENesin (MUCINEX PO) Take 1 tablet by mouth 2 (two) times daily as needed.  For cough      . HYDROcodone-acetaminophen (NORCO/VICODIN) 5-325 MG per tablet Take 1 tablet by mouth every 6 (six) hours as needed.       . mirtazapine (REMERON) 15 MG tablet Take 15 mg by mouth at bedtime.       . Multiple Vitamin (MULTIVITAMIN) tablet Take 1 tablet by mouth 2 (two) times daily.      . pantoprazole (PROTONIX) 40 MG tablet Take 40 mg by mouth daily.      . predniSONE (DELTASONE) 5 MG tablet Take 10 mg by mouth daily.      . simvastatin (ZOCOR) 40 MG tablet Take 1 tablet by mouth daily.      Marland Kitchen torsemide (DEMADEX) 20 MG tablet Take 2 tablets (40 mg total) by mouth 2 (two) times daily.      . VENTOLIN HFA 108 (90 BASE) MCG/ACT inhaler Inhale 2 puffs into the lungs every 4 (four) hours as needed.      . vitamin C (ASCORBIC ACID) 250 MG  tablet Take 250 mg by mouth daily.        No results found for this or any previous visit (from the past 48 hour(s)). No results found.  Review of Systems  Constitutional: Positive for malaise/fatigue. Negative for fever and chills.  HENT: Negative for ear pain.   Eyes: Negative for blurred vision and pain.  Respiratory: Positive for cough and shortness of breath. Negative for hemoptysis.   Cardiovascular: Positive for chest pain and leg swelling. Negative for palpitations.  Gastrointestinal: Positive for heartburn. Negative for nausea and vomiting.  Genitourinary: Negative for dysuria and hematuria.  Musculoskeletal: Negative for myalgias.  Skin: Negative for rash.  Neurological: Negative for tingling, tremors and headaches.  Endo/Heme/Allergies: Bruises/bleeds easily.  Psychiatric/Behavioral: Negative for suicidal ideas and substance abuse.    Blood pressure 92/61, pulse 80, temperature 97.8 F (36.6 C), temperature source Axillary, resp. rate 22, height $RemoveBe'5\' 7"'IJrobFTUz$  (1.702 m), weight 81.8 kg (180 lb 5.4 oz), SpO2 99.00%. Physical Exam  Nursing note and vitals reviewed. Constitutional: He is oriented to person, place, and time. No distress.  Appears stated age  HENT:  Head: Normocephalic and atraumatic.  Nose: Nose normal.  Mouth/Throat: Oropharynx is clear and moist. No oropharyngeal exudate.  Eyes: Conjunctivae and EOM are normal. Pupils are equal, round, and reactive to light. No scleral icterus.  Neck: Normal range of motion. Neck supple. JVD present. No thyromegaly present.  Cardiovascular: Normal rate, regular rhythm, normal heart sounds and intact distal pulses.  Exam reveals no gallop.   No murmur heard. Occasional ectopy  Respiratory: Effort normal. No respiratory distress. He has rales.  Poor air movement, scattered rales at the bases  GI: Soft. Bowel sounds are normal. He exhibits no distension. There is no tenderness. There is no rebound.  Musculoskeletal: Normal range  of motion. He exhibits edema.  Trace to 1+ edema in extremities  Neurological: He is alert and oriented to person, place, and time. No cranial nerve deficit.  Skin: Skin is warm and dry. He is not diaphoretic. No erythema.  Psychiatric: He has a normal mood and affect. His behavior is normal. Thought content normal.    Labs reviewed; na 137, K 3.2, co2 27, cl 96, bun/cr 52/2.5, ast/alt 29/23, alk phos 72, ckmb 4.5, Trop I 0.07 (elevated at 0.03), d-dimer 0.75, wbc 7.3, h/h 11.8/35.6, plt 211 6/13 Echo with global hypo, EF <20%, restrictive DD LHC 2/07 with severe 3v native disease, patent bypass grafts x3  ecg  reviewed; trigeminy, HR 90, SR, LAD, poor R-wave progression, IVCD, LBBB Assessment/Plan Brett Baldwin is an 80 yo man with PMH of CAD and CABG '95 @ [facility] with last known EF <20%, restrictive diastolic dysfunction who presented to [facility] with dyspnea/shortness of breath, cough and chest discomfort since this AM at 09:00. Differential for chest pain and/or dyspnea are broad including pulmonary embolism, musculoskeletal pain, ACS, microvascular ischemia, heart failure, GERD, esophageal spasm, among other etiologies.    1. Chest pain: differential as above - telemetry, on heparin, trend cardiac biomarkers - tsh, BNP, hemoglobin a1c, lipid panel - chest x-ray - asa 81 mg daily now, on bb 2. Dyspnea and elevated d-dimer - V/Q scan, on heparin gtt 3. Known CAD with previous CABG  4. Heart failure: continue home medications  - bisoprolol/hctz 2.5/6.25 mg, torsemide 20 mg --> will give 40 mg IV lasix now and daily dose 5. Chronic renal failure, Cr 2.5 - renally dose medications, no ace- 2/2 renal function 6. GERD: continue protonix 40 mg 7. Dyslipidemia: continue simvastatin - update lipid profile  Brett Baldwin 12/10/2013, 10:02 PM

## 2013-12-10 NOTE — Progress Notes (Signed)
ANTICOAGULATION CONSULT NOTE - Initial Consult  Pharmacy Consult for Heparin Indication: chest pain/ACS  No Known Allergies  Patient Measurements: Height: 5\' 7"  (170.2 cm) Weight: 180 lb 5.4 oz (81.8 kg) IBW/kg (Calculated) : 66.1 Heparin Dosing Weight: 81.8 kg  Vital Signs: Temp: 97.8 F (36.6 C) (09/30 2150) Temp src: Axillary (09/30 2150) BP: 92/61 mmHg (09/30 2150) Pulse Rate: 80 (09/30 2150)  Labs: No results found for this basename: HGB, HCT, PLT, APTT, LABPROT, INR, HEPARINUNFRC, CREATININE, CKTOTAL, CKMB, TROPONINI,  in the last 72 hours  Estimated Creatinine Clearance: 27.5 ml/min (by C-G formula based on Cr of 1.9).   Medical History: Past Medical History  Diagnosis Date  . Hypertension   . History of pulmonary embolism     No pulmonary embolus on the current CT scan as of 03/01/2009  . Carotid artery stenosis      Status post CT of head and neck. Right common carotid artery and right internal carotid artery were occluded not contiguous string sign detected. Prominent irregular calcified plaque noted in the left carotid artery with an estimated 60% diameter stenosis. Also 75% stenosis in the proximal vertebral artery..  . COPD (chronic obstructive pulmonary disease)   . Pulmonary nodules   . Swallowing difficulty   . CAD (coronary artery disease)     Coronary artery bypass grafting 1995 at [facility].  . Ischemic cardiomyopathy   . Chronic systolic heart failure   . Chronic kidney disease   . Weight loss     Medications:  Prescriptions prior to admission  Medication Sig Dispense Refill  . bisoprolol-hydrochlorothiazide (ZIAC) 5-6.25 MG per tablet Take 1 tablet by mouth daily.      . cholecalciferol (VITAMIN D) 1000 UNITS tablet Take 1,000 Units by mouth daily.      . citalopram (CELEXA) 10 MG tablet Take 10 mg by mouth daily.      . Cyanocobalamin (B-12 PO) Take 1 tablet by mouth daily.      . fluticasone (FLONASE) 50 MCG/ACT nasal spray Place 2  sprays into both nostrils daily.      . GuaiFENesin (MUCINEX PO) Take 1 tablet by mouth 2 (two) times daily as needed. For cough      . HYDROcodone-acetaminophen (NORCO/VICODIN) 5-325 MG per tablet Take 1 tablet by mouth every 6 (six) hours as needed.       . mirtazapine (REMERON) 15 MG tablet Take 15 mg by mouth at bedtime.       . Multiple Vitamin (MULTIVITAMIN) tablet Take 1 tablet by mouth 2 (two) times daily.      . pantoprazole (PROTONIX) 40 MG tablet Take 40 mg by mouth daily.      . predniSONE (DELTASONE) 5 MG tablet Take 10 mg by mouth daily.      . simvastatin (ZOCOR) 40 MG tablet Take 1 tablet by mouth daily.      Marland Kitchen. torsemide (DEMADEX) 20 MG tablet Take 2 tablets (40 mg total) by mouth 2 (two) times daily.      . VENTOLIN HFA 108 (90 BASE) MCG/ACT inhaler Inhale 2 puffs into the lungs every 4 (four) hours as needed.      . vitamin C (ASCORBIC ACID) 250 MG tablet Take 250 mg by mouth daily.       Scheduled:  . [START ON 12/11/2013] aspirin EC  81 mg Oral Daily  . [START ON 12/11/2013] bisoprolol-hydrochlorothiazide  1 tablet Oral Daily  . [START ON 12/11/2013] cholecalciferol  1,000 Units Oral Daily  . [  START ON 12/11/2013] citalopram  10 mg Oral Daily  . [START ON 12/11/2013] fluticasone  2 spray Each Nare Daily  . furosemide  80 mg Intravenous Once  . [START ON 12/11/2013] furosemide  80 mg Intravenous Daily  . multivitamin with minerals  1 tablet Oral BID  . [START ON 12/11/2013] pantoprazole  40 mg Oral Daily  . [START ON 12/11/2013] predniSONE  10 mg Oral Q breakfast  . simvastatin  40 mg Oral Daily  . [START ON 12/11/2013] vitamin C  250 mg Oral Daily   Assessment: 80yo male presents from [facility] with chest pain, dyspnea/SOB, and cough. He was loaded (unknown amount) with heparin and they started a heparin infusion at 1260 units/hr at 1730 prior to transfer. Pharmacy is consulted to dose heparin for ACS rule out and chest pain. Levels scanned from [facility] show H/H of 12.7/38,  Plt 251, and sCr 2.4 with CrCl ~24 mL/min.  Goal of Therapy:  Heparin level 0.3-0.7 units/ml Monitor platelets by anticoagulation protocol: Yes   Plan:  Continue heparin infusion at 1260 units/hr Check anti-Xa level in 8 hours and daily while on heparin Continue to monitor H&H and platelets  Arlean Hopping. Newman Pies, PharmD Clinical Pharmacist Pager [phone] 12/10/2013,10:33 PM

## 2013-12-11 ENCOUNTER — Inpatient Hospital Stay (HOSPITAL_COMMUNITY): Payer: Medicare Other

## 2013-12-11 ENCOUNTER — Observation Stay (HOSPITAL_COMMUNITY): Payer: Medicare Other

## 2013-12-11 DIAGNOSIS — I2510 Atherosclerotic heart disease of native coronary artery without angina pectoris: Secondary | ICD-10-CM | POA: Diagnosis present

## 2013-12-11 DIAGNOSIS — N189 Chronic kidney disease, unspecified: Secondary | ICD-10-CM | POA: Diagnosis present

## 2013-12-11 DIAGNOSIS — J441 Chronic obstructive pulmonary disease with (acute) exacerbation: Secondary | ICD-10-CM | POA: Diagnosis present

## 2013-12-11 DIAGNOSIS — R079 Chest pain, unspecified: Secondary | ICD-10-CM

## 2013-12-11 DIAGNOSIS — Z86711 Personal history of pulmonary embolism: Secondary | ICD-10-CM | POA: Diagnosis not present

## 2013-12-11 DIAGNOSIS — Z7952 Long term (current) use of systemic steroids: Secondary | ICD-10-CM | POA: Diagnosis not present

## 2013-12-11 DIAGNOSIS — E785 Hyperlipidemia, unspecified: Secondary | ICD-10-CM | POA: Diagnosis present

## 2013-12-11 DIAGNOSIS — I10 Essential (primary) hypertension: Secondary | ICD-10-CM

## 2013-12-11 DIAGNOSIS — I255 Ischemic cardiomyopathy: Secondary | ICD-10-CM | POA: Diagnosis present

## 2013-12-11 DIAGNOSIS — Z87891 Personal history of nicotine dependence: Secondary | ICD-10-CM | POA: Diagnosis not present

## 2013-12-11 DIAGNOSIS — I5043 Acute on chronic combined systolic (congestive) and diastolic (congestive) heart failure: Secondary | ICD-10-CM | POA: Diagnosis present

## 2013-12-11 DIAGNOSIS — Z951 Presence of aortocoronary bypass graft: Secondary | ICD-10-CM | POA: Diagnosis not present

## 2013-12-11 DIAGNOSIS — K219 Gastro-esophageal reflux disease without esophagitis: Secondary | ICD-10-CM | POA: Diagnosis present

## 2013-12-11 DIAGNOSIS — Z79899 Other long term (current) drug therapy: Secondary | ICD-10-CM | POA: Diagnosis not present

## 2013-12-11 DIAGNOSIS — I5041 Acute combined systolic (congestive) and diastolic (congestive) heart failure: Secondary | ICD-10-CM

## 2013-12-11 DIAGNOSIS — N184 Chronic kidney disease, stage 4 (severe): Secondary | ICD-10-CM | POA: Diagnosis present

## 2013-12-11 DIAGNOSIS — I129 Hypertensive chronic kidney disease with stage 1 through stage 4 chronic kidney disease, or unspecified chronic kidney disease: Secondary | ICD-10-CM | POA: Diagnosis present

## 2013-12-11 DIAGNOSIS — E876 Hypokalemia: Secondary | ICD-10-CM | POA: Diagnosis present

## 2013-12-11 LAB — HEPATIC FUNCTION PANEL
ALBUMIN: 2.8 g/dL — AB (ref 3.5–5.2)
ALK PHOS: 59 U/L (ref 39–117)
ALT: 17 U/L (ref 0–53)
AST: 21 U/L (ref 0–37)
Bilirubin, Direct: <0.2 mg/dL (ref 0.0–0.3)
TOTAL PROTEIN: 6.4 g/dL (ref 6.0–8.3)
Total Bilirubin: 0.4 mg/dL (ref 0.3–1.2)

## 2013-12-11 LAB — BASIC METABOLIC PANEL
ANION GAP: 16 — AB (ref 5–15)
Anion gap: 13 (ref 5–15)
BUN: 51 mg/dL — ABNORMAL HIGH (ref 6–23)
BUN: 51 mg/dL — ABNORMAL HIGH (ref 6–23)
CALCIUM: 8.5 mg/dL (ref 8.4–10.5)
CHLORIDE: 100 meq/L (ref 96–112)
CO2: 26 mEq/L (ref 19–32)
CO2: 29 meq/L (ref 19–32)
CREATININE: 2.19 mg/dL — AB (ref 0.50–1.35)
Calcium: 8.5 mg/dL (ref 8.4–10.5)
Chloride: 97 mEq/L (ref 96–112)
Creatinine, Ser: 2.29 mg/dL — ABNORMAL HIGH (ref 0.50–1.35)
GFR calc Af Amer: 29 mL/min — ABNORMAL LOW (ref >90)
GFR calc non Af Amer: 24 mL/min — ABNORMAL LOW (ref >90)
GFR calc non Af Amer: 25 mL/min — ABNORMAL LOW (ref >90)
GFR, EST AFRICAN AMERICAN: 28 mL/min — AB (ref >90)
Glucose, Bld: 107 mg/dL — ABNORMAL HIGH (ref 70–99)
Glucose, Bld: 146 mg/dL — ABNORMAL HIGH (ref 70–99)
Potassium: 3.2 mEq/L — ABNORMAL LOW (ref 3.7–5.3)
Potassium: 4.2 mEq/L (ref 3.7–5.3)
Sodium: 142 mEq/L (ref 137–147)
Sodium: 139 mEq/L (ref 137–147)

## 2013-12-11 LAB — CBC WITH DIFFERENTIAL/PLATELET
BASOS ABS: 0 10*3/uL (ref 0.0–0.1)
BASOS PCT: 0 % (ref 0–1)
Eosinophils Absolute: 0.5 10*3/uL (ref 0.0–0.7)
Eosinophils Relative: 6 % — ABNORMAL HIGH (ref 0–5)
HCT: 34.1 % — ABNORMAL LOW (ref 39.0–52.0)
Hemoglobin: 11.4 g/dL — ABNORMAL LOW (ref 13.0–17.0)
Lymphocytes Relative: 38 % (ref 12–46)
Lymphs Abs: 3 10*3/uL (ref 0.7–4.0)
MCH: 30.1 pg (ref 26.0–34.0)
MCHC: 33.4 g/dL (ref 30.0–36.0)
MCV: 90 fL (ref 78.0–100.0)
MONOS PCT: 9 % (ref 3–12)
Monocytes Absolute: 0.8 10*3/uL (ref 0.1–1.0)
NEUTROS ABS: 3.8 10*3/uL (ref 1.7–7.7)
Neutrophils Relative %: 47 % (ref 43–77)
Platelets: 180 10*3/uL (ref 150–400)
RBC: 3.79 MIL/uL — ABNORMAL LOW (ref 4.22–5.81)
RDW: 15.5 % (ref 11.5–15.5)
WBC: 8.1 10*3/uL (ref 4.0–10.5)

## 2013-12-11 LAB — MRSA PCR SCREENING: MRSA by PCR: NEGATIVE

## 2013-12-11 LAB — LIPID PANEL
Cholesterol: 231 mg/dL — ABNORMAL HIGH (ref 0–200)
HDL: 63 mg/dL (ref >39)
LDL Cholesterol: 138 mg/dL — ABNORMAL HIGH (ref 0–99)
TRIGLYCERIDES: 149 mg/dL (ref <150)
Total CHOL/HDL Ratio: 3.7 RATIO
VLDL: 30 mg/dL (ref 0–40)

## 2013-12-11 LAB — PROTIME-INR
INR: 1.07 (ref 0.00–1.49)
Prothrombin Time: 13.9 seconds (ref 11.6–15.2)

## 2013-12-11 LAB — TROPONIN I
Troponin I: <0.3 ng/mL (ref <0.30)
Troponin I: <0.3 ng/mL (ref <0.30)

## 2013-12-11 LAB — HEMOGLOBIN A1C
HEMOGLOBIN A1C: 6.5 % — AB (ref <5.7)
MEAN PLASMA GLUCOSE: 140 mg/dL — AB (ref <117)

## 2013-12-11 LAB — TSH: TSH: 1.38 u[IU]/mL (ref 0.350–4.500)

## 2013-12-11 LAB — HEPARIN LEVEL (UNFRACTIONATED)
Heparin Unfractionated: 0.71 IU/mL — ABNORMAL HIGH (ref 0.30–0.70)
Heparin Unfractionated: 0.9 IU/mL — ABNORMAL HIGH (ref 0.30–0.70)
Heparin Unfractionated: 1.08 IU/mL — ABNORMAL HIGH (ref 0.30–0.70)

## 2013-12-11 LAB — PRO B NATRIURETIC PEPTIDE: Pro B Natriuretic peptide (BNP): 6667 pg/mL — ABNORMAL HIGH (ref 0–450)

## 2013-12-11 MED ORDER — FUROSEMIDE 10 MG/ML IJ SOLN: 80.0000 mg | Freq: Two times a day (BID) | INTRAMUSCULAR | Status: DC

## 2013-12-11 MED ORDER — TECHNETIUM TO 99M ALBUMIN AGGREGATED
6.0000 | Freq: Once | INTRAVENOUS | Status: AC | PRN
  Administered 2013-12-11: 6 via INTRAVENOUS

## 2013-12-11 MED ORDER — POTASSIUM CHLORIDE CRYS ER 20 MEQ PO TBCR
20.0000 meq | EXTENDED_RELEASE_TABLET | Freq: Every day | ORAL | Status: DC
  Administered 2013-12-11 – 2013-12-17 (×7): 20 meq via ORAL
  Filled 2013-12-11 (×8): qty 1

## 2013-12-11 MED ORDER — TECHNETIUM TC 99M DIETHYLENETRIAME-PENTAACETIC ACID: 40.0000 | Freq: Once | INTRAVENOUS | Status: AC | PRN

## 2013-12-11 MED ORDER — HEPARIN (PORCINE) IN NACL 100-0.45 UNIT/ML-% IJ SOLN
800.0000 [IU]/h | INTRAMUSCULAR | Status: DC
Start: 2013-12-11 — End: 2013-12-11
  Filled 2013-12-11: qty 250

## 2013-12-11 MED ORDER — POTASSIUM CHLORIDE CRYS ER 20 MEQ PO TBCR
40.0000 meq | EXTENDED_RELEASE_TABLET | Freq: Once | ORAL | Status: AC
  Administered 2013-12-11: 40 meq via ORAL
  Filled 2013-12-11: qty 2

## 2013-12-11 MED ORDER — HEPARIN SODIUM (PORCINE) 5000 UNIT/ML IJ SOLN
5000.0000 [IU] | Freq: Three times a day (TID) | INTRAMUSCULAR | Status: DC
  Administered 2013-12-11 – 2013-12-17 (×17): 5000 [IU] via SUBCUTANEOUS
  Filled 2013-12-11 (×21): qty 1

## 2013-12-11 MED ORDER — MORPHINE SULFATE 2 MG/ML IJ SOLN
1.0000 mg | INTRAMUSCULAR | Status: DC | PRN
  Administered 2013-12-11 – 2013-12-15 (×2): 1 mg via INTRAVENOUS
  Filled 2013-12-11 (×2): qty 1

## 2013-12-11 MED ORDER — FUROSEMIDE 10 MG/ML IJ SOLN
80.0000 mg | Freq: Two times a day (BID) | INTRAMUSCULAR | Status: DC
  Administered 2013-12-11 – 2013-12-14 (×6): 80 mg via INTRAVENOUS
  Filled 2013-12-11 (×8): qty 8

## 2013-12-11 MED ORDER — GI COCKTAIL ~~LOC~~
30.0000 mL | Freq: Two times a day (BID) | ORAL | Status: DC | PRN
Start: 2013-12-11 — End: 2013-12-17
  Filled 2013-12-11: qty 30

## 2013-12-11 NOTE — Progress Notes (Signed)
Subjective: No CP.  Breathing better after treatment.  Left first toe mildly tender.   Objective: Vital signs in last 24 hours: Temp:  [97.8 F (36.6 C)-98.3 F (36.8 C)] 98.3 F (36.8 C) (10/01 0836) Pulse Rate:  [71-80] 76 (10/01 0836) Resp:  [14-22] 16 (10/01 0836) BP: (77-123)/(46-71) 107/46 mmHg (10/01 0836) SpO2:  [96 %-100 %] 100 % (10/01 0836) Weight:  [180 lb 5.4 oz (81.8 kg)] 180 lb 5.4 oz (81.8 kg) (09/30 2150)    Intake/Output from previous day:   Intake/Output this shift:    Medications Current Facility-Administered Medications  Medication Dose Route Frequency Provider Last Rate Last Dose  . acetaminophen (TYLENOL) tablet 650 mg  650 mg Oral Q4H PRN Leeann MustJacob Kelly, MD   650 mg at 12/11/13 0450  . albuterol (PROVENTIL) (2.5 MG/3ML) 0.083% nebulizer solution 3 mL  3 mL Inhalation Q4H PRN Leeann MustJacob Kelly, MD   3 mL at 12/11/13 0351  . aspirin EC tablet 81 mg  81 mg Oral Daily Leeann MustJacob Kelly, MD   81 mg at 12/11/13 0902  . bisoprolol-hydrochlorothiazide (ZIAC) 5-6.25 MG per tablet 1 tablet  1 tablet Oral Daily Leeann MustJacob Kelly, MD   1 tablet at 12/11/13 0902  . cholecalciferol (VITAMIN D) tablet 1,000 Units  1,000 Units Oral Daily Leeann MustJacob Kelly, MD   1,000 Units at 12/11/13 0902  . citalopram (CELEXA) tablet 10 mg  10 mg Oral Daily Leeann MustJacob Kelly, MD   10 mg at 12/11/13 0902  . fluticasone (FLONASE) 50 MCG/ACT nasal spray 2 spray  2 spray Each Nare Daily Leeann MustJacob Kelly, MD   2 spray at 12/11/13 [phone]  . furosemide (LASIX) injection 80 mg  80 mg Intravenous Once Leeann MustJacob Kelly, MD      . furosemide (LASIX) injection 80 mg  80 mg Intravenous Daily Leeann MustJacob Kelly, MD   80 mg at 12/11/13 0902  . gi cocktail (Maalox,Lidocaine,Donnatal)  30 mL Oral BID PRN Leeann MustJacob Kelly, MD      . guaiFENesin [facility](MUCINEX) 12 hr tablet 600 mg  600 mg Oral BID PRN Leeann MustJacob Kelly, MD   600 mg at 12/11/13 0450  . heparin ADULT infusion 100 units/mL (25000 units/250 mL)  1,100 Units/hr Intravenous Continuous Colleen CanVeronda Pauline Bryk,  [facility] 11 mL/hr at 12/11/13 0547 1,100 Units/hr at 12/11/13 0547  . HYDROcodone-acetaminophen (NORCO/VICODIN) 5-325 MG per tablet 1 tablet  1 tablet Oral Q6H PRN Leeann MustJacob Kelly, MD      . mirtazapine (REMERON) tablet 15 mg  15 mg Oral QHS PRN Leeann MustJacob Kelly, MD      . morphine 2 MG/ML injection 1 mg  1 mg Intravenous Q3H PRN Leeann MustJacob Kelly, MD   1 mg at 12/11/13 0430  . multivitamin with minerals tablet 1 tablet  1 tablet Oral BID Leeann MustJacob Kelly, MD   1 tablet at 12/11/13 0902  . ondansetron (ZOFRAN) injection 4 mg  4 mg Intravenous Q6H PRN Leeann MustJacob Kelly, MD      . pantoprazole (PROTONIX) EC tablet 40 mg  40 mg Oral Daily Leeann MustJacob Kelly, MD   40 mg at 12/11/13 0902  . predniSONE (DELTASONE) tablet 10 mg  10 mg Oral Q breakfast Leeann MustJacob Kelly, MD   10 mg at 12/11/13 [phone]  . simvastatin (ZOCOR) tablet 40 mg  40 mg Oral Daily Leeann MustJacob Kelly, MD   40 mg at 12/11/13 0902  . vitamin C (ASCORBIC ACID) tablet 250 mg  250 mg Oral Daily Leeann MustJacob Kelly, MD   250 mg at 12/11/13 [phone]  PE: General appearance: alert, cooperative and no distress Neck: Hard to see JVD Lungs: Bilateral rales Heart: irregularly irregular rhythm and No MM.  Distant heart sounds Abdomen: hyperactive BS.  nontender.  +distention  Extremities: No LEE Pulses: 2+ and symmetric 1+ PTs. 0 DPs Skin: Warm and dry.  no erythema in the LE/toes.  Feet warm but not excessively.  Neurologic: Grossly normal but not his usual talkative self according to his wife.   Lab Results:   Recent Labs  12/11/13 0423  WBC 8.1  HGB 11.4*  HCT 34.1*  PLT 180   BMET  Recent Labs  12/11/13 0423  NA 142  K 3.2*  CL 100  CO2 26  GLUCOSE 107*  BUN 51*  CREATININE 2.29*  CALCIUM 8.5   PT/INR  Recent Labs  12/10/13 2334  LABPROT 13.9  INR 1.07   Cholesterol  Recent Labs  12/11/13 0440  CHOL 231*   Lipid Panel     Component Value Date/Time   CHOL 231* 12/11/2013 0440   TRIG 149 12/11/2013 0440   HDL 63 12/11/2013 0440   CHOLHDL 3.7 12/11/2013 0440    VLDL 30 12/11/2013 0440   LDLCALC 138* 12/11/2013 0440     Cardiac Panel (last 3 results)  Recent Labs  12/10/13 2334 12/11/13 0423  TROPONINI <0.30 <0.30    Assessment/Plan 80 yo man with PMH of CAD and CABG '95 @ [facility] with last known EF <20%, restrictive diastolic dysfunction, tobacco abuse until about age 69, who presented to [facility] with dyspnea/shortness of breath, cough and chest discomfort since this AM at 09:00. Gradual 20# weight gain over the last several months.  Has been on prednisone.  He also worked in the [employer] for 30 years.    Principal Problem:   Chest pain Troponin negative times 2.  He apparently had an elevated Ddimer and troponin at [facility].  V/Q scan pending.  On ASA and beta blocker.  No current EKG.  A lot of PVCs/bigenminy on tele.  IV heparin.  Resume low sodium diet.  Troponin elevation probably from CHF.   Active Problems:   Dyslipidemia  Statin    Acute on Chronic systolic heart failure Breathing better after albuterol nebs.  May be related to prednisone.  BNP 6667.0.  IV lasix 80mg  given and ordered daily.  CXR: Cardiomegaly with pulmonary vascular congestion and possible mild  interstitial edema.  +abdominal distention.  Check LFTs.      CKD  No UOP from 1600hrs yesterday to 0400hrs today and none since.  Bladder scan this morning.  Renal US .       Hypertension  BP controlled.    GERD (gastroesophageal reflux disease)    Hypokalemia  Replace with now and with lasix.       Coronary atherosclerosis of native coronary artery    Ischemic cardiomyopathy   LOS: 1 day    Earnesteen Birnie PA-C 12/11/2013 10:49 AM

## 2013-12-11 NOTE — Progress Notes (Addendum)
Pt voided ; post void residual completed and pt had in bladder.

## 2013-12-11 NOTE — Progress Notes (Signed)
ANTICOAGULATION CONSULT NOTE - Follow Up Consult  Pharmacy Consult for heparin Indication: chest pain/ACS  Labs:  Recent Labs  12/10/13 2334 12/11/13 0113  LABPROT 13.9  --   INR 1.07  --   HEPARINUNFRC  --  0.71*  TROPONINI <0.30  --     Assessment/Plan:  80yo male very slightly supratherapeutic on heparin though suspect that pt needs a little more time to clear bolus given at OSH.  Will continue gtt at current rate and confirm stable with next lab draw.  Vernard GamblesVeronda Codie Hainer, PharmD, BCPS  12/11/2013,2:04 AM

## 2013-12-11 NOTE — Progress Notes (Addendum)
I have personally interviewed and examined patient.  Admitted with increased SOB and chest xray consistent with volume overload.  Denies any chest pain at present and suspect his discomfort on admission was due to CHF.  [facility] but subsequent Cardiac enzymes neg x 2.  D-Dimer elevated and VQ scan obtained and initially read as intermediate probability but reviewed with Radiologist and is actually low prob scan.  SOB has improved with diuresis.  He now has a foley cath in and is diuresing well.  Will continue ASA and BB.  Will continue IV diuretics increase to 80mg  BID and replete potassium.  Prednisone and CKD may be contributing to volume overload.  Will check 2D echo to assess LVF (last echo 2011).  Change IV Heparin gtt to SQ for VTE prophylaxis.

## 2013-12-11 NOTE — Progress Notes (Signed)
UR completed 

## 2013-12-11 NOTE — Progress Notes (Signed)
ANTICOAGULATION CONSULT NOTE - Follow Up Consult  Pharmacy Consult for Heparin Indication: chest pain/ACS  No Known Allergies  Patient Measurements: Height: 5\' 7"  (170.2 cm) Weight: 180 lb 5.4 oz (81.8 kg) IBW/kg (Calculated) : 66.1 Heparin Dosing Weight: 81.8kg  Vital Signs: Temp: 97.4 F (36.3 C) (10/01 1641) Temp Source: Axillary (10/01 1641) BP: 106/36 mmHg (10/01 1641) Pulse Rate: 71 (10/01 1641)  Labs:  Recent Labs  12/10/13 2334 12/11/13 0113 12/11/13 0423 12/11/13 0458 12/11/13 1047 12/11/13 1600  HGB  --   --  11.4*  --   --   --   HCT  --   --  34.1*  --   --   --   PLT  --   --  180  --   --   --   LABPROT 13.9  --   --   --   --   --   INR 1.07  --   --   --   --   --   HEPARINUNFRC  --  0.71*  --  0.90*  --  1.08*  CREATININE  --   --  2.29*  --   --   --   TROPONINI <0.30  --  <0.30  --  <0.30  --     Estimated Creatinine Clearance: 22.8 ml/min (by C-G formula based on Cr of 2.29).   Medications:  Heparin @ 1100 units/hr  Assessment: 89yom continues on heparin chest pain. Troponins negative. VQ scan with intermediate probability for PE. Heparin level is above goal and continues to increase despite multiple rate decreases. Level drawn correctly. No bleeding reported.  Goal of Therapy:  Heparin level 0.3-0.7 units/ml Monitor platelets by anticoagulation protocol: Yes   Plan:  1) Hold heparin for 30 minutes 2) Resume at 800 units/hr 3) Check heparin level 8 hours after resumed  Fredrik RiggerMarkle, Dashana Guizar Sue 12/11/2013,4:52 PM

## 2013-12-11 NOTE — Progress Notes (Signed)
ANTICOAGULATION CONSULT NOTE - Follow Up Consult  Pharmacy Consult for heparin Indication: chest pain/ACS  Labs:  Recent Labs  12/10/13 2334 12/11/13 0113 12/11/13 0423 12/11/13 0458  HGB  --   --  11.4*  --   HCT  --   --  34.1*  --   PLT  --   --  180  --   LABPROT 13.9  --   --   --   INR 1.07  --   --   --   HEPARINUNFRC  --  0.71*  --  0.90*  CREATININE  --   --  2.29*  --   TROPONINI <0.30  --  <0.30  --     Assessment: 80yo male now with higher heparin level, apparently accumulating.  Goal of Therapy:  Heparin level 0.3-0.7 units/ml   Plan:  Will decrease heparin gtt by 2 units/kg/hr to 1100 units/hr and check level in 8hr.  Vernard GamblesVeronda Dereck Agerton, PharmD, BCPS  12/11/2013,5:47 AM

## 2013-12-12 ENCOUNTER — Encounter (HOSPITAL_COMMUNITY): Payer: Self-pay | Admitting: Nurse Practitioner

## 2013-12-12 ENCOUNTER — Inpatient Hospital Stay (HOSPITAL_COMMUNITY): Payer: Medicare Other

## 2013-12-12 DIAGNOSIS — I359 Nonrheumatic aortic valve disorder, unspecified: Secondary | ICD-10-CM

## 2013-12-12 DIAGNOSIS — I5023 Acute on chronic systolic (congestive) heart failure: Secondary | ICD-10-CM

## 2013-12-12 DIAGNOSIS — R0789 Other chest pain: Secondary | ICD-10-CM

## 2013-12-12 DIAGNOSIS — N184 Chronic kidney disease, stage 4 (severe): Secondary | ICD-10-CM | POA: Diagnosis present

## 2013-12-12 LAB — BASIC METABOLIC PANEL
Anion gap: 12 (ref 5–15)
BUN: 45 mg/dL — AB (ref 6–23)
CHLORIDE: 97 meq/L (ref 96–112)
CO2: 32 meq/L (ref 19–32)
CREATININE: 2.06 mg/dL — AB (ref 0.50–1.35)
Calcium: 9.8 mg/dL (ref 8.4–10.5)
GFR calc non Af Amer: 27 mL/min — ABNORMAL LOW (ref >90)
GFR, EST AFRICAN AMERICAN: 31 mL/min — AB (ref >90)
Glucose, Bld: 137 mg/dL — ABNORMAL HIGH (ref 70–99)
POTASSIUM: 4.5 meq/L (ref 3.7–5.3)
Sodium: 141 mEq/L (ref 137–147)

## 2013-12-12 MED ORDER — ATORVASTATIN CALCIUM 40 MG PO TABS
40.0000 mg | ORAL_TABLET | Freq: Every day | ORAL | Status: DC
  Administered 2013-12-12 – 2013-12-16 (×5): 40 mg via ORAL
  Filled 2013-12-12 (×6): qty 1

## 2013-12-12 NOTE — Progress Notes (Signed)
2D echo showed slight improvement in EF since 2013 at which time EF was 20% and now EF 25-30%.  ? LV thrombus at apex - will get limited 2D echo in am with definity contrast.  Signed: Armanda Magicraci Manna Gose, MD

## 2013-12-12 NOTE — Clinical Documentation Improvement (Signed)
MD's, NP's, and PA's  Documentation of "CKD"  in patient with GFR of "24, 25" ,  BUN " 51, 51" and  CRT "2.19, 2.29" please document stage of CKD if appropriate for this admission. Thank you   . Document the stage of CKD  Chronic kidney disease, stage 3 (moderate) - GFR 30-59  Chronic kidney disease, stage 4 (severe) - GFR 15-29  Chronic kidney disease, stage 5- GFR < 15  End-stage renal disease (ESRD)   Supporting Information: see labs above  Evaluation: BMET    Thank You,   Lavonda JumboLawanda J Apoorva Bugay  Clinical Documentation Specialist RN, BSN, CCDS: HIM [phone]

## 2013-12-12 NOTE — Progress Notes (Addendum)
I have personally seen and examined patient and agree with note as outlined by Ward Givenshris Berge, NP.  Continue IV diuretics as he is clinically still volume overloaded.  Continue BB and avoid ACE I/ARB in setting of renal insuff.  Consider adding low dose Hydralazine/nitrate nearing discharge but for now will hold off as BP has been borderline soft and need to maintain adequate BP while diuresing to prevent worsening renal function.  He is wheezing quite a bit this am with nonproductive cough but is afebrile with normal WBC.  Will repeat chest xray today.  Will give PRN breathing treatments.

## 2013-12-12 NOTE — Progress Notes (Signed)
Echocardiogram 2D Echocardiogram has been performed.  Brett Baldwin 12/12/2013, 4:45 PM

## 2013-12-12 NOTE — Progress Notes (Signed)
Patient Name: Kupono Marling Date of Encounter: 12/12/2013     Principal Problem:   Acute combined systolic and diastolic heart failure Active Problems:   Ischemic cardiomyopathy   Coronary atherosclerosis of native coronary artery   Hypertension   Midsternal chest pain   CKD (chronic kidney disease), stage IV   Dyslipidemia   GERD (gastroesophageal reflux disease)    SUBJECTIVE  No chest pain.  Breathing better but now with productive cough and congestion.  Less abd girth but still more than baseline.  Good output yesterday.  Wt pending this morning.  CURRENT MEDS . aspirin EC  81 mg Oral Daily  . bisoprolol-hydrochlorothiazide  1 tablet Oral Daily  . cholecalciferol  1,000 Units Oral Daily  . citalopram  10 mg Oral Daily  . fluticasone  2 spray Each Nare Daily  . furosemide  80 mg Intravenous BID  . heparin subcutaneous  5,000 Units Subcutaneous 3 times per day  . multivitamin with minerals  1 tablet Oral BID  . pantoprazole  40 mg Oral Daily  . potassium chloride  20 mEq Oral Daily  . predniSONE  10 mg Oral Q breakfast  . simvastatin  40 mg Oral Daily  . vitamin C  250 mg Oral Daily    OBJECTIVE  Filed Vitals:   12/11/13 2025 12/12/13 0100 12/12/13 0421 12/12/13 0803  BP: 114/56 125/59 125/45 123/50  Pulse: 70 72 73 73  Temp: 97.8 F (36.6 C) 98 F (36.7 C) 97.8 F (36.6 C) 97.5 F (36.4 C)  TempSrc: Oral Oral Oral Oral  Resp: 24 16 17 23   Height:      Weight:      SpO2: 99% 98% 98% 96%    Intake/Output Summary (Last 24 hours) at 12/12/13 1055 Last data filed at 12/12/13 0800  Gross per 24 hour  Intake 199.57 ml  Output   2550 ml  Net -2350.43 ml   Filed Weights   12/10/13 2150  Weight: 180 lb 5.4 oz (81.8 kg)    PHYSICAL EXAM  General: Pleasant, NAD. Neuro: Alert and oriented X 3. Moves all extremities spontaneously. Psych: Normal affect. HEENT:  Normal  Neck: Supple without bruits.  JVD to jaw. Lungs:  Resp regular and unlabored,  bibasilar crackles and coarse breath sounds throughout. Heart: RRR - somewhat obscured by lung sounds.  No s3, s4, or murmurs. Abdomen: Soft, protuberant, non-tender, BS + x 4.  Extremities: No clubbing, cyanosis or edema. DP/PT/Radials 1+ and equal bilaterally.  Accessory Clinical Findings  CBC  Recent Labs  12/11/13 0423  WBC 8.1  NEUTROABS 3.8  HGB 11.4*  HCT 34.1*  MCV 90.0  PLT 180   Basic Metabolic Panel  Recent Labs  12/11/13 0423 12/11/13 2130  NA 142 139  K 3.2* 4.2  CL 100 97  CO2 26 29  GLUCOSE 107* 146*  BUN 51* 51*  CREATININE 2.29* 2.19*  CALCIUM 8.5 8.5   Liver Function Tests  Recent Labs  12/11/13 1047  AST 21  ALT 17  ALKPHOS 59  BILITOT 0.4  PROT 6.4  ALBUMIN 2.8*   Cardiac Enzymes  Recent Labs  12/10/13 2334 12/11/13 0423 12/11/13 1047  TROPONINI <0.30 <0.30 <0.30   Hemoglobin A1C  Recent Labs  12/10/13 2334  HGBA1C 6.5*   Fasting Lipid Panel  Recent Labs  12/11/13 0440  CHOL 231*  HDL 63  LDLCALC 138*  TRIG 149  CHOLHDL 3.7   Thyroid Function Tests  Recent Labs  12/10/13 2334  TSH 1.380    TELE  Rsr, freq pvc's couplets, bigeminy.  Radiology/Studies  [place] Renal  12/11/2013   CLINICAL DATA:  Decreased urinary output. History of hypertension. Chronic kidney disease.  EXAM: RENAL/URINARY TRACT ULTRASOUND COMPLETE  IMPRESSION: No hydronephrosis.  Bilateral renal cortical thinning and increased echogenicity compatible with chronic renal disease.  Multiple bilateral hypoechoic renal lesions, not definitely characterized on current examination. Patient has known bilateral renal cysts.   Electronically Signed   By: Annia Beltrew  Davis M.D.   On: 12/11/2013 16:48   [facility] Pulmonary Perf And Vent  12/11/2013   ADDENDUM REPORT: 12/11/2013 19:47  ADDENDUM: The patient's physician states that she has low clinical suspicion of pulmonary embolus. This information, as well as reconsideration of the significance of the defect seen  involving the superior segment of left lower lobe, suggests low probability of pulmonary embolus.   Electronically Signed   By: Roque LiasJames  Green M.D.   On: 12/11/2013 19:47   12/11/2013   CLINICAL DATA:  Shortness of breath.  Elevated D-dimer level.  EXAM: NUCLEAR MEDICINE VENTILATION - PERFUSION LUNG SCAN  IMPRESSION: To match moderate to large defects are seen involving the left upper lobe consistent for intermediate probability of pulmonary embolus.  Electronically Signed: By: Roque LiasJames  Green M.D. On: 12/11/2013 15:58   **Reviewed with radiology by Dr. Mayford Knifeurner - felt to be low prob.  Dg Chest Port 1 View  12/11/2013   CLINICAL DATA:  Shortness of breath, left chest pain  EXAM: PORTABLE CHEST - 1 VIEW  COMPARISON:  [facility] chest radiograph dated 12/10/2013 at 1641 hrs.  FINDINGS: Cardiomegaly with pulmonary vascular congestion and possible mild interstitial edema. No pleural effusion or pneumothorax.  Postsurgical changes related to prior CABG.  IMPRESSION: Cardiomegaly with pulmonary vascular congestion and possible mild interstitial edema.   Electronically Signed   By: Charline BillsSriyesh  Krishnan M.D.   On: 12/11/2013 00:43    ASSESSMENT AND PLAN  1.  Acute on chronic combined syst/diast CHF:  EF < 20% by echo in 08/2011.  -2.3 L since admission.  Not weighed since 9/30.  He was 160 lbs in June but was in the 130's - 140's for most of the last 2 years.  180 lbs on admission.  Wife says that wt has really escalated since starting prednisone for shaking.  Still with JVP to jaw and bibasilar crackles.  Cont IV lasix, bb.  No acei/arb/spiro/ARNI 2/2 CKD IV.  Consider low-dose hydral/nitrate for afterload reduction, though may not tolerate as pressures have been trending low 100's to 120's.  2.  CAD/Midsternal chest pain:  Mild troponin elevation @ OSH but normal troponins here.  V:Q scan yesterday initially read out as intermediate probability however after review with Dr. Mayford Knifeurner - felt to be low-prob.  Cont asa, bb,  statin.  3.  CKD IV:  Creat stable last night (bmet checked @ 9pm).  Cont to follow closely with diuresis.  4.  HTN:  Stable.  5.  HL: LDL 138.  He is on simva 40 here and at home.  Inadequate lipid lowering.  Will switch to atorva.  6.  Cough:  Congested with productive cough.  Afebrile.  Nl WBC.  Cont guaifenesin.    Signed, Nicolasa Duckinghristopher Ariell Gunnels NP

## 2013-12-13 DIAGNOSIS — R079 Chest pain, unspecified: Secondary | ICD-10-CM

## 2013-12-13 LAB — BASIC METABOLIC PANEL
ANION GAP: 15 (ref 5–15)
BUN: 46 mg/dL — AB (ref 6–23)
CO2: 28 meq/L (ref 19–32)
CREATININE: 2.3 mg/dL — AB (ref 0.50–1.35)
Calcium: 9.6 mg/dL (ref 8.4–10.5)
Chloride: 99 mEq/L (ref 96–112)
GFR calc non Af Amer: 24 mL/min — ABNORMAL LOW (ref >90)
GFR, EST AFRICAN AMERICAN: 28 mL/min — AB (ref >90)
Glucose, Bld: 101 mg/dL — ABNORMAL HIGH (ref 70–99)
POTASSIUM: 3.9 meq/L (ref 3.7–5.3)
Sodium: 142 mEq/L (ref 137–147)

## 2013-12-13 MED ORDER — BISOPROLOL FUMARATE 5 MG PO TABS
2.5000 mg | ORAL_TABLET | Freq: Every day | ORAL | Status: DC
  Administered 2013-12-14 – 2013-12-16 (×3): 2.5 mg via ORAL
  Filled 2013-12-13 (×5): qty 0.5

## 2013-12-13 MED ORDER — TERBINAFINE HCL 1 % EX CREA
TOPICAL_CREAM | Freq: Two times a day (BID) | CUTANEOUS | Status: DC
  Administered 2013-12-13 – 2013-12-16 (×8): via TOPICAL
  Filled 2013-12-13: qty 12

## 2013-12-13 NOTE — Progress Notes (Signed)
Patient ID: Brett Baldwin, male   DOB: 1923-11-25, 80 y.o.   MRN: [id number]     Subjective:    Continued SOB, cough   Objective:   Temp:  [97.7 F (36.5 C)-98.6 F (37 C)] 98.6 F (37 C) (10/03 1142) Pulse Rate:  [67-88] 67 (10/03 1200) Resp:  [14-34] 14 (10/03 1200) BP: (82-130)/(46-69) 112/46 mmHg (10/03 1200) SpO2:  [92 %-99 %] 97 % (10/03 1213) Weight:  [164 lb 0.4 oz (74.4 kg)] 164 lb 0.4 oz (74.4 kg) (10/03 0500) Last BM Date: 12/11/13  Filed Weights   12/10/13 2150 12/12/13 1200 12/13/13 0500  Weight: 180 lb 5.4 oz (81.8 kg) 164 lb 0.4 oz (74.4 kg) 164 lb 0.4 oz (74.4 kg)    Intake/Output Summary (Last 24 hours) at 12/13/13 1313 Last data filed at 12/13/13 0830  Gross per 24 hour  Intake    960 ml  Output   1900 ml  Net   -940 ml    Exam:  General: NAD  Resp: bilateral expiratory wheezing  Cardiac: RRR, no m/r/g, JVD to angle of jaw  GI: abdomen soft, NT, ND  MSK: no LE edema. Mild erythema in toe webspaces  Neuro: no focal deficits   Lab Results:  Basic Metabolic Panel:  Recent Labs Lab 12/11/13 2130 12/12/13 1348 12/13/13 0400  NA 139 141 142  K 4.2 4.5 3.9  CL 97 97 99  CO2 29 32 28  GLUCOSE 146* 137* 101*  BUN 51* 45* 46*  CREATININE 2.19* 2.06* 2.30*  CALCIUM 8.5 9.8 9.6    Liver Function Tests:  Recent Labs Lab 12/11/13 1047  AST 21  ALT 17  ALKPHOS 59  BILITOT 0.4  PROT 6.4  ALBUMIN 2.8*    CBC:  Recent Labs Lab 12/11/13 0423  WBC 8.1  HGB 11.4*  HCT 34.1*  MCV 90.0  PLT 180    Cardiac Enzymes:  Recent Labs Lab 12/10/13 2334 12/11/13 0423 12/11/13 1047  TROPONINI <0.30 <0.30 <0.30    BNP:  Recent Labs  12/10/13 2334  PROBNP 6667.0*    Coagulation:  Recent Labs Lab 12/10/13 2334  INR 1.07    ECG:   Medications:   Scheduled Medications: . aspirin EC  81 mg Oral Daily  . atorvastatin  40 mg Oral q1800  . bisoprolol-hydrochlorothiazide  1 tablet Oral Daily  . cholecalciferol   1,000 Units Oral Daily  . citalopram  10 mg Oral Daily  . fluticasone  2 spray Each Nare Daily  . furosemide  80 mg Intravenous BID  . heparin subcutaneous  5,000 Units Subcutaneous 3 times per day  . multivitamin with minerals  1 tablet Oral BID  . pantoprazole  40 mg Oral Daily  . potassium chloride  20 mEq Oral Daily  . predniSONE  10 mg Oral Q breakfast  . vitamin C  250 mg Oral Daily     Infusions:     PRN Medications:  acetaminophen, albuterol, gi cocktail, guaiFENesin, HYDROcodone-acetaminophen, mirtazapine, morphine injection, ondansetron (ZOFRAN) IV     Assessment/Plan    1. Acute on chronic combined systolic/diastolic HF - 12/12/13 LVEF 25-30%, grade I diastolic dysfunction - negative 925 mL yesterday, total negative 3 liters since admission. He is on lasix IV 80mg  bid. Cr is trending up. Though from old labs baseline appears to be 2-2.4.  - continue bisoprolol, no ACE given poor renal function. Previous orthostatic symptoms on hydral - soft blood pressures, stop HCTZ, decrease bisoprolol to 2.5mg  daily  2. ?Apical thrombus - awaiting definity echo study today   3. Toe pain - pain the webspaces of both feet, mild erythema, no skin break down. Will try course of antifungal cream.    Dina Rich, M.D.

## 2013-12-13 NOTE — Progress Notes (Signed)
Echo Lab  Limited 2D Echocardiogram completed with [facility].  Leighana Neyman L Brielynn Sekula, RDCS 12/13/2013 1:53 PM

## 2013-12-14 DIAGNOSIS — J449 Chronic obstructive pulmonary disease, unspecified: Secondary | ICD-10-CM

## 2013-12-14 LAB — BASIC METABOLIC PANEL
ANION GAP: 14 (ref 5–15)
BUN: 50 mg/dL — AB (ref 6–23)
CHLORIDE: 94 meq/L — AB (ref 96–112)
CO2: 29 meq/L (ref 19–32)
CREATININE: 2.44 mg/dL — AB (ref 0.50–1.35)
Calcium: 9.9 mg/dL (ref 8.4–10.5)
GFR calc non Af Amer: 22 mL/min — ABNORMAL LOW (ref >90)
GFR, EST AFRICAN AMERICAN: 26 mL/min — AB (ref >90)
Glucose, Bld: 99 mg/dL (ref 70–99)
POTASSIUM: 3.7 meq/L (ref 3.7–5.3)
Sodium: 137 mEq/L (ref 137–147)

## 2013-12-14 MED ORDER — FUROSEMIDE 10 MG/ML IJ SOLN
80.0000 mg | Freq: Every day | INTRAMUSCULAR | Status: DC
  Filled 2013-12-14: qty 8

## 2013-12-14 MED ORDER — IPRATROPIUM-ALBUTEROL 0.5-2.5 (3) MG/3ML IN SOLN
3.0000 mL | Freq: Four times a day (QID) | RESPIRATORY_TRACT | Status: DC
Start: 2013-12-14 — End: 2013-12-14
  Administered 2013-12-14 (×2): 3 mL via RESPIRATORY_TRACT
  Filled 2013-12-14 (×2): qty 3

## 2013-12-14 MED ORDER — AZITHROMYCIN 250 MG PO TABS
250.0000 mg | ORAL_TABLET | Freq: Every day | ORAL | Status: DC
  Administered 2013-12-15 – 2013-12-17 (×3): 250 mg via ORAL
  Filled 2013-12-14 (×3): qty 1

## 2013-12-14 MED ORDER — IPRATROPIUM-ALBUTEROL 0.5-2.5 (3) MG/3ML IN SOLN
3.0000 mL | Freq: Four times a day (QID) | RESPIRATORY_TRACT | Status: DC
  Administered 2013-12-15: 3 mL via RESPIRATORY_TRACT
  Filled 2013-12-14: qty 3

## 2013-12-14 MED ORDER — AZITHROMYCIN 500 MG PO TABS
500.0000 mg | ORAL_TABLET | Freq: Once | ORAL | Status: AC
  Administered 2013-12-14: 500 mg via ORAL
  Filled 2013-12-14: qty 1

## 2013-12-14 NOTE — Progress Notes (Addendum)
Patient ID: Brett Baldwin, male   DOB: [birth year], 80 y.o.   MRN: [id number]    Primary cardiologist:  Subjective:    + SOB, cough  Objective:   Temp:  [98 F (36.7 C)-98.6 F (37 C)] 98.5 F (36.9 C) (10/04 0815) Pulse Rate:  [33-81] 77 (10/04 0815) Resp:  [13-21] 19 (10/04 0815) BP: (82-131)/(35-61) 107/53 mmHg (10/04 0815) SpO2:  [93 %-99 %] 95 % (10/04 0815) Weight:  [171 lb 8 oz (77.792 kg)] 171 lb 8 oz (77.792 kg) (10/04 0900) Last BM Date: 12/12/13  Filed Weights   12/12/13 1200 12/13/13 0500 12/14/13 0900  Weight: 164 lb 0.4 oz (74.4 kg) 164 lb 0.4 oz (74.4 kg) 171 lb 8 oz (77.792 kg)    Intake/Output Summary (Last 24 hours) at 12/14/13 1119 Last data filed at 12/14/13 0945  Gross per 24 hour  Intake   1320 ml  Output   1275 ml  Net     45 ml    Telemetry: SR, PVCs  Exam:  General: NAD  Resp: bilateral wheezing  Cardiac: RRR, no m/r/g, no JVD  GI: abdomen soft, NT, ND  MSK: no LE edema  Neuro: no focal deficits  Psych: appropriate affect  Lab Results:  Basic Metabolic Panel:  Recent Labs Lab 12/12/13 1348 12/13/13 0400 12/14/13 0313  NA 141 142 137  K 4.5 3.9 3.7  CL 97 99 94*  CO2 32 28 29  GLUCOSE 137* 101* 99  BUN 45* 46* 50*  CREATININE 2.06* 2.30* 2.44*  CALCIUM 9.8 9.6 9.9    Liver Function Tests:  Recent Labs Lab 12/11/13 1047  AST 21  ALT 17  ALKPHOS 59  BILITOT 0.4  PROT 6.4  ALBUMIN 2.8*    CBC:  Recent Labs Lab 12/11/13 0423  WBC 8.1  HGB 11.4*  HCT 34.1*  MCV 90.0  PLT 180    Cardiac Enzymes:  Recent Labs Lab 12/10/13 2334 12/11/13 0423 12/11/13 1047  TROPONINI <0.30 <0.30 <0.30    BNP:  Recent Labs  12/10/13 2334  PROBNP 6667.0*    Coagulation:  Recent Labs Lab 12/10/13 2334  INR 1.07    ECG:   Medications:   Scheduled Medications: . aspirin EC  81 mg Oral Daily  . atorvastatin  40 mg Oral q1800  . bisoprolol  2.5 mg Oral Daily  . cholecalciferol  1,000 Units Oral  Daily  . citalopram  10 mg Oral Daily  . fluticasone  2 spray Each Nare Daily  . furosemide  80 mg Intravenous BID  . heparin subcutaneous  5,000 Units Subcutaneous 3 times per day  . multivitamin with minerals  1 tablet Oral BID  . pantoprazole  40 mg Oral Daily  . potassium chloride  20 mEq Oral Daily  . predniSONE  10 mg Oral Q breakfast  . terbinafine   Topical BID  . vitamin C  250 mg Oral Daily     Infusions:     PRN Medications:  acetaminophen, albuterol, gi cocktail, guaiFENesin, HYDROcodone-acetaminophen, mirtazapine, morphine injection, ondansetron (ZOFRAN) IV  12/13/13 Echo Contrast study Impressions:  - Limited study shows severely depressed LV systolic function, EF<15%, global hypokinesis. There is no LV thrombus.     Assessment/Plan    1. Acute on chronic combined systolic/diastolic HF  - hx of ICM, prior CABG in 1995 at [facility], last cath 2007 with severe native vessel disease, bypass grafts patent x3. - 12/12/13 LVEF 25-30%, grade I diastolic dysfunction. Contrast study shows LVEF< 15,  no apical thrombus. LVEF has had some variation over the years, in 2013 <20%.   - negative 635 mL yesterday, total negative 3 liters since admission. He is on lasix IV 80mg  bid. Cr is trending up. Though from old labs baseline appears to be 2-2.4. Will change to lasix IV qday. - continue bisoprolol, no ACE given poor renal function. Previous orthostatic symptoms on hydral  - soft blood pressures, stopped HCTZ, decreased bisoprolol to 2.5mg  daily  - CXR 12/12/13 showed improved aeration and edema, no infiltrate   2. Toe pain  - pain the webspaces of both feet, mild erythema, no skin break down. Will try course of antifungal cream.  3. Cough/COPD - mild WBC, no fevers. Continued SOB and cough despite diuresis - CXR without infiltrate, suspect possible bronchitis. Will start course of azithromycin - change prn albuterol to scheduled duonebs      Dina RichJonathan Branch, M.D.

## 2013-12-15 LAB — BASIC METABOLIC PANEL
ANION GAP: 17 — AB (ref 5–15)
BUN: 58 mg/dL — ABNORMAL HIGH (ref 6–23)
CO2: 26 meq/L (ref 19–32)
Calcium: 10 mg/dL (ref 8.4–10.5)
Chloride: 93 mEq/L — ABNORMAL LOW (ref 96–112)
Creatinine, Ser: 2.5 mg/dL — ABNORMAL HIGH (ref 0.50–1.35)
GFR calc non Af Amer: 21 mL/min — ABNORMAL LOW (ref >90)
GFR, EST AFRICAN AMERICAN: 25 mL/min — AB (ref >90)
Glucose, Bld: 110 mg/dL — ABNORMAL HIGH (ref 70–99)
POTASSIUM: 4.6 meq/L (ref 3.7–5.3)
Sodium: 136 mEq/L — ABNORMAL LOW (ref 137–147)

## 2013-12-15 MED ORDER — IPRATROPIUM-ALBUTEROL 0.5-2.5 (3) MG/3ML IN SOLN
3.0000 mL | Freq: Three times a day (TID) | RESPIRATORY_TRACT | Status: DC
  Administered 2013-12-15 – 2013-12-17 (×6): 3 mL via RESPIRATORY_TRACT
  Filled 2013-12-15 (×6): qty 3

## 2013-12-15 MED ORDER — FUROSEMIDE 80 MG PO TABS
80.0000 mg | ORAL_TABLET | Freq: Two times a day (BID) | ORAL | Status: DC
  Administered 2013-12-15 – 2013-12-17 (×5): 80 mg via ORAL
  Filled 2013-12-15 (×7): qty 1

## 2013-12-15 NOTE — Progress Notes (Signed)
    Subjective:  Feels better. Breathing improved at rest. No chest pain.  Objective:  Vital Signs in the last 24 hours: Temp:  [97.4 F (36.3 C)-98.5 F (36.9 C)] 98 F (36.7 C) (10/05 0444) Pulse Rate:  [33-106] 85 (10/05 0444) Resp:  [16-23] 19 (10/05 0444) BP: (106-122)/(46-68) 111/57 mmHg (10/05 0444) SpO2:  [91 %-97 %] 91 % (10/05 0732) Weight:  [171 lb 8 oz (77.792 kg)] 171 lb 8 oz (77.792 kg) (10/04 0900)  Intake/Output from previous day: 10/04 0701 - 10/05 0700 In: 1440 [P.O.:1440] Out: 750 [Urine:750]  Physical Exam: Pt is alert and oriented, elderly male in NAD. Somewhat somnolent but follows commands, responds to all questions appropriately. HEENT: normal Neck: JVP - normal Lungs: no rales, but expiratory rhonchi present CV: RRR with, distant heart sounds Abd: soft, NT, Positive BS, no hepatomegaly Ext: no C/C/E Skin: warm/dry no rash  Lab Results: No results found for this basename: WBC, HGB, PLT,  in the last 72 hours  Recent Labs  12/14/13 0313 12/15/13 0338  NA 137 136*  K 3.7 4.6  CL 94* 93*  CO2 29 26  GLUCOSE 99 110*  BUN 50* 58*  CREATININE 2.44* 2.50*   No results found for this basename: TROPONINI, CK, MB,  in the last 72 hours  Cardiac Studies: 2D Echo: Impressions:  - Limited study shows severely depressed LV systolic function, EF<15%, global hypokinesis. There is no LV thrombus.  Assessment/Plan:  1. Acute on chronic systolic heart failure. Reviewed I/O's, weights, and labs. Physical exam appears to be improved from volume perspective. Creatinine up to 2.5 which represents continued slow increase from baseline. I would favor switching him back to oral diuretics today. He has had orthostatic intolerance with hydralazine in past, so doubt he will tolerate uptitration of heart failure meds any further. Continue bisoprolol. No ACE/ARB/Aldactone in setting stage IV CKD.  2. Stage IV CKD: limits diuresis. Continue with plan as above.    3. CAD, native vessel. Advanced disease noted at cath in 2007. No anginal sx's. Continue med Rx.  4. COPD exacerbation with cough, question acute bronchitis. Pt now on azithromycin. Would complete treatment course.  5. Dispo: pt lives at home with wife. He is unsteady at baseline but sounds fairly functional. Will obtain PT consult today to help assess d/c needs. Anticipate d/c next 24-48 hours.  Tonny BollmanMichael Quinn Bartling, M.D. 12/15/2013, 7:52 AM

## 2013-12-15 NOTE — Telephone Encounter (Signed)
Spoke with Pts wife  [facility]

## 2013-12-15 NOTE — Evaluation (Signed)
Occupational Therapy Evaluation Patient Details Name: Bryer Cozzolino MRN: [id number] DOB: December 26, 1923 Today's Date: 12/15/2013    History of Present Illness Pt is an 80 yr old male admitted for COPD exacerbation and acute bronchitis.  PMH of CAD and CABG with bypass surgery in 1995 at [facility].   Clinical Impression   Mr. Baltimore presents with decreased overall endurance and balance for basic selfcare tasks.  Currently min assist level for selfcare tasks and related functional transfers without use of assistive device.  Increased SOB with minimal exertion as well as body tremor with sit to stand.  Oxygen sats 94-97% on room air with HR at 93 BPM.  BP in sitting on one attempt 84/66 but increased on the next intervals at 151/72 and 154/43.  Taken in standing at 140/63 to finish session.  Feel pt will benefit from acute care OT to increase overall independence for selfcare tasks with [facility] for follow-up.  Pt's wife can provide 24 hour supervision at discharge.      Follow Up Recommendations  Home health OT    Equipment Recommendations  3 in 1 bedside comode       Precautions / Restrictions Precautions Precautions: Fall Restrictions Weight Bearing Restrictions: No      Mobility Bed Mobility Overal bed mobility: Needs Assistance Bed Mobility: Supine to Sit     Supine to sit: Min guard     General bed mobility comments: Pt transitioned to sitting with use of the bed rail for support.  Transfers Overall transfer level: Needs assistance Equipment used: 1 person hand held assist Transfers: Sit to/from [employer] Sit to Stand: Min assist Stand pivot transfers: Min assist       General transfer comment: Pt with initial tremors in his hips, pelvis, and trunk with standing.  Decreased once he started mobility.    Balance Overall balance assessment: Needs assistance   Sitting balance-Leahy Scale: Good       Standing balance-Leahy Scale:  Fair Standing balance comment: Pt able to stand without support statically but needs UE support for security.                            ADL Overall ADL's : Needs assistance/impaired     Grooming: Wash/dry hands;Sitting;Supervision/safety   Upper Body Bathing: Supervision/ safety;Sitting   Lower Body Bathing: Minimal assistance;Sit to/from stand Lower Body Bathing Details (indicate cue type and reason): Pt needs assistance with reaching feet. Upper Body Dressing : Supervision/safety   Lower Body Dressing: Moderate assistance;Sit to/from stand Lower Body Dressing Details (indicate cue type and reason): Pt with increased SOB and decreased flexibility for reaching either foot for dressing tasks. Toilet Transfer: Minimal assistance;BSC (simulated)   Toileting- Clothing Manipulation and Hygiene: Minimal assistance;Sit to/from stand       Functional mobility during ADLs: Minimal assistance General ADL Comments: Pt will benefit from AE for LB selfcare as he demonstrates limited flexibility and increased SOB when attempting to reach down to his LEs.  He is not able to bring his LEs up and cross at the knees either.         Perception Perception Perception Tested?: No   Praxis Praxis Praxis tested?: Not tested    Pertinent Vitals/Pain Pain Assessment: No/denies pain     Hand Dominance Left   Extremity/Trunk Assessment Upper Extremity Assessment Upper Extremity Assessment: Overall WFL for tasks assessed (slight tremors noted in bilateral UEs with right being slightly more severe than  the left. )   Lower Extremity Assessment Lower Extremity Assessment: Defer to PT evaluation   Cervical / Trunk Assessment Cervical / Trunk Assessment: Normal   Communication Communication Communication: No difficulties   Cognition Arousal/Alertness: Awake/alert Behavior During Therapy: WFL for tasks assessed/performed Overall Cognitive Status: Within Functional Limits for tasks  assessed                                Home Living Family/patient expects to be discharged to:: Private residence Living Arrangements: Spouse/significant other Available Help at Discharge: Family;Available 24 hours/day Type of Home: House Home Access: Stairs to enter [employer] CorporationEntrance Stairs-Number of Steps: 2   Home Layout: One level;Other (Comment) (Has basement)     Bathroom Shower/Tub: Tub/shower unit Shower/tub characteristics: Door Bathroom Toilet: Standard Bathroom Accessibility: Yes   Home Equipment: Emergency planning/management officerhower seat;Walker - 2 wheels;Cane - single point   Additional Comments: Pt ambulated around the house using furniture for stabilization most of the time.      Prior Functioning/Environment Level of Independence: Needs assistance  Gait / Transfers Assistance Needed: modified independent ADL's / Homemaking Assistance Needed: supervision/min assist.  Pt needed assistance for socks and shoes. Communication / Swallowing Assistance Needed: WFLs      OT Diagnosis: Generalized weakness   OT Problem List: Decreased strength;Decreased activity tolerance;Impaired balance (sitting and/or standing);Cardiopulmonary status limiting activity;Decreased knowledge of use of DME or AE   OT Treatment/Interventions: Self-care/ADL training;Balance training;Patient/family education;Therapeutic activities;DME and/or AE instruction    OT Goals(Current goals can be found in the care plan section) Acute Rehab OT Goals Patient Stated Goal: Pt did not state but agreed to get up and into the bedside chair Potential to Achieve Goals: Good  OT Frequency: Min 2X/week              End of Session Equipment Utilized During Treatment: Gait belt Nurse Communication: Mobility status  Activity Tolerance: Patient limited by fatigue Patient left: in chair;with family/visitor present;with call bell/phone within reach   Time: 1420-1455 OT Time Calculation (min): 35 min Charges:  OT General  Charges $OT Visit: 1 Procedure OT Evaluation $Initial OT Evaluation Tier I: 1 Procedure OT Treatments $Self Care/Home Management : 23-37 mins  Hailynn Slovacek OTR/L 12/15/2013, 4:11 PM

## 2013-12-16 ENCOUNTER — Ambulatory Visit: Payer: Medicare Other | Admitting: Cardiology

## 2013-12-16 LAB — BASIC METABOLIC PANEL
ANION GAP: 18 — AB (ref 5–15)
BUN: 59 mg/dL — ABNORMAL HIGH (ref 6–23)
CHLORIDE: 95 meq/L — AB (ref 96–112)
CO2: 27 meq/L (ref 19–32)
Calcium: 10 mg/dL (ref 8.4–10.5)
Creatinine, Ser: 2.39 mg/dL — ABNORMAL HIGH (ref 0.50–1.35)
GFR calc non Af Amer: 23 mL/min — ABNORMAL LOW (ref >90)
GFR, EST AFRICAN AMERICAN: 26 mL/min — AB (ref >90)
Glucose, Bld: 126 mg/dL — ABNORMAL HIGH (ref 70–99)
POTASSIUM: 4.7 meq/L (ref 3.7–5.3)
Sodium: 140 mEq/L (ref 137–147)

## 2013-12-16 NOTE — Evaluation (Signed)
Physical Therapy Evaluation Patient Details Name: Brett Baldwin MRN: [id number] DOB: 07-12-1923 Today's Date: 12/16/2013   History of Present Illness  Pt is an 80 yr old male admitted for COPD exacerbation and acute bronchitis.  PMH of CAD and CABG with bypass [facility] in [facility] at [facility].  Clinical Impression  Pt admitted with the above. Pt currently with functional limitations due to the deficits listed below (see PT Problem List). At the time of PT eval pt was able to perform transfers and ambulation with RW and close guarding due to tremors in hips/trunk that pt states have been present since December. Pt will benefit from skilled PT to increase their independence and safety with mobility to allow discharge to the venue listed below.       Follow Up Recommendations Home health PT;Supervision for mobility/OOB    Equipment Recommendations  Other (comment) (Rollator)    Recommendations for Other Services       Precautions / Restrictions Precautions Precautions: Fall Restrictions Weight Bearing Restrictions: No      Mobility  Bed Mobility Overal bed mobility: Needs Assistance Bed Mobility: Supine to Sit     Supine to sit: Min guard     General bed mobility comments: Pt transitioned to sitting with use of the bed rail for support. Increased time to scoot to EOB due to coughing.   Transfers Overall transfer level: Needs assistance Equipment used: Rolling walker (2 wheeled) Transfers: Sit to/from Stand Sit to Stand: Min guard         General transfer comment: Pt able to power-up to full standing with min guard assist for safety. Pt with tremors in hips and trunk, however no LOB was noted.   Ambulation/Gait Ambulation/Gait assistance: Min guard Ambulation Distance (Feet): 100 Feet Assistive device: Rolling walker (2 wheeled) Gait Pattern/deviations: Step-through pattern;Decreased stride length;Trunk flexed;Narrow base of support Gait velocity:  Decreased Gait velocity interpretation: Below normal speed for age/gender General Gait Details: Pt states that if he takes small steps his tremors decrease. Multiple instances of tremors during gait training, in which he states "it feels like my hips will give out on me, but they haven't so far."  Stairs            Wheelchair Mobility    Modified Rankin (Stroke Patients Only)       Balance Overall balance assessment: Needs assistance Sitting-balance support: Feet supported;No upper extremity supported Sitting balance-Leahy Scale: Good     Standing balance support: Bilateral upper extremity supported;During functional activity Standing balance-Leahy Scale: Poor Standing balance comment: Pt requires hands-on support due to tremors if he does not have an AD.                              Pertinent Vitals/Pain Pain Assessment: No/denies pain    Home Living Family/patient expects to be discharged to:: Private residence Living Arrangements: Spouse/significant other Available Help at Discharge: Family;Available 24 hours/day Type of Home: House Home Access: Stairs to enter   [employer] Corporation[employer]trance Stairs-Number of Steps: 2 Home Layout: Two level;Able to live on main level with bedroom/bathroom;Laundry or work area in [employer]ement Home Equipment: Emergency planning/management officerhower seat;Walker - 2 wheels;Cane - single point Additional Comments: Pt ambulated around the house using furniture for stabilization most of the time.    Prior Function Level of Independence: Needs assistance   Gait / Transfers Assistance Needed: modified independent  ADL's / Homemaking Assistance Needed: supervision/min assist.  Pt needed assistance for socks  and shoes.        Hand Dominance   Dominant Hand: Left    Extremity/Trunk Assessment   Upper Extremity Assessment: Defer to OT evaluation           Lower Extremity Assessment: Generalized weakness (Tremors - pt states feels like his hips will give out)       Cervical / Trunk Assessment: Normal  Communication   Communication: HOH  Cognition Arousal/Alertness: Awake/alert Behavior During Therapy: WFL for tasks assessed/performed Overall Cognitive Status: Within Functional Limits for tasks assessed                      General Comments      Exercises        Assessment/Plan    PT Assessment Patient needs continued PT services  PT Diagnosis Difficulty walking;Generalized weakness   PT Problem List Decreased strength;Decreased range of motion;Decreased activity tolerance;Decreased balance;Decreased mobility;Decreased knowledge of use of DME;Decreased safety awareness;Decreased knowledge of precautions;Cardiopulmonary status limiting activity  PT Treatment Interventions DME instruction;Gait training;Stair training;Functional mobility training;Therapeutic activities;Therapeutic exercise;Neuromuscular re-education;Patient/family education   PT Goals (Current goals can be found in the Care Plan section) Acute Rehab PT Goals Patient Stated Goal: Get his tremors and coughing under control PT Goal Formulation: With patient/family Time For Goal Achievement: 12/23/13 Potential to Achieve Goals: Good    Frequency Min 3X/week   Barriers to discharge        Co-evaluation               End of Session Equipment Utilized During Treatment: Gait belt Activity Tolerance: Patient tolerated treatment well Patient left: Other (comment) (On toilet in bathroom with wife present and NT notified) Nurse Communication: Mobility status         Time: 1191-4782 PT Time Calculation (min): 27 min   Charges:   PT Evaluation $Initial PT Evaluation Tier I: 1 Procedure PT Treatments $Gait Training: 8-22 mins $Therapeutic Activity: 8-22 mins   PT G Codes:          Conni Slipper 12/16/2013, 11:32 AM  Conni Slipper, PT, DPT Acute Rehabilitation Services Pager: [phone]

## 2013-12-16 NOTE — Progress Notes (Signed)
Occupational Therapy Treatment Patient Details Name: Brett Baldwin MRN: [id number] DOB: 1923-06-01 Today's Date: 12/16/2013    History of present illness Pt is an 80 yr old male admitted for COPD exacerbation and acute bronchitis.  PMH of CAD and CABG with bypass surgery in 1995 at [facility].   OT comments  Pt instructed in use of AE for LB dressing and in energy conservation strategies.  Pt fatigued following PT and toileting.    Follow Up Recommendations  Home health OT    Equipment Recommendations  3 in 1 bedside comode    Recommendations for Other Services      Precautions / Restrictions Precautions Precautions: Fall Restrictions Weight Bearing Restrictions: No       Mobility Bed Mobility Overal bed mobility: Needs Assistance Bed Mobility: Supine to Sit     Supine to sit: Min guard     General bed mobility comments: Pt up in chair.  Transfers Overall transfer level: Needs assistance Equipment used: Rolling walker (2 wheeled) Transfers: Sit to/from Stand Sit to Stand: Min guard         General transfer comment: Pt able to power-up to full standing with min guard assist for safety. Pt with tremors in hips and trunk, however no LOB was noted.     Balance Overall balance assessment: Needs assistance Sitting-balance support: Feet supported;No upper extremity supported Sitting balance-Leahy Scale: Good     Standing balance support: Bilateral upper extremity supported;During functional activity Standing balance-Leahy Scale: Poor Standing balance comment: Pt requires hands-on support due to tremors if he does not have an AD.                    ADL Overall ADL's : Needs assistance/impaired     Grooming: Min guard;Standing;Wash/dry hands         Lower Body Bathing Details (indicate cue type and reason): pt uses a bath brush on a handle at home to reach his feet       Lower Body Dressing Details (indicate cue type and reason):  Educated pt and wife in use of reacher, sock aid, and long handled shoe horn.     Toileting- Clothing Manipulation and Hygiene: Minimal assistance;Sit to/from stand       Functional mobility during ADLs: Minimal assistance General ADL Comments: Educated pt and wife in energy conservation strategies and gave handout.      Vision                     Perception     Praxis      Cognition   Behavior During Therapy: Flat affect Overall Cognitive Status: Within Functional Limits for tasks assessed                       Extremity/Trunk Assessment  Upper Extremity Assessment Upper Extremity Assessment: Defer to OT evaluation   Lower Extremity Assessment Lower Extremity Assessment: Generalized weakness (Tremors - pt states feels like his hips will give out)   Cervical / Trunk Assessment Cervical / Trunk Assessment: Normal    Exercises     Shoulder Instructions       General Comments      Pertinent Vitals/ Pain       Pain Assessment: No/denies pain  Home Living Family/patient expects to be discharged to:: Private residence Living Arrangements: Spouse/significant other Available Help at Discharge: Family;Available 24 hours/day Type of Home: House Home Access: Stairs to enter [employer] CorporationEntrance Stairs-Number of Steps: 2  Home Layout: Two level;Able to live on main level with bedroom/bathroom;Laundry or work area in [employer] Equipment: Emergency planning/management officer - 2 wheels;Cane - single point   Additional Comments: Pt ambulated around the house using furniture for stabilization most of the time.      Prior Functioning/Environment Level of Independence: Needs assistance  Gait / Transfers Assistance Needed: modified independent ADL's / Homemaking Assistance Needed: supervision/min assist.  Pt needed assistance for socks and shoes.       Frequency Min 2X/week     Progress Toward Goals  OT Goals(current goals can now be found in the care  plan section)  Progress towards OT goals: Progressing toward goals  Acute Rehab OT Goals Patient Stated Goal: Get his tremors and coughing under control Potential to Achieve Goals: Good  Plan Discharge plan remains appropriate    Co-evaluation                 End of Session     Activity Tolerance     Patient Left in chair;with family/visitor present;with call bell/phone within reach   Nurse Communication          Time: 1110-1143 OT Time Calculation (min): 33 min  Charges: OT General Charges $OT Visit: 1 Procedure OT Treatments $Self Care/Home Management : 23-37 mins  Evern Bio 12/16/2013, 1:59 PM

## 2013-12-16 NOTE — Progress Notes (Signed)
Patient Profile: Brett Baldwin is an 80 yo man with PMH of CAD and CABG '95 @ [facility] with last known EF <20%, restrictive diastolic dysfunction, admitted for acute on chronic CHF and COPD exacerbation.    Subjective: Breathing has improved. Main complaint is persistent, non productive cough. Denies chest pain.   Objective: Vital signs in last 24 hours: Temp:  [97.4 F (36.3 C)-98 F (36.7 C)] 97.4 F (36.3 C) (10/06 0809) Pulse Rate:  [80-93] 80 (10/06 0809) Resp:  [18-28] 18 (10/06 0809) BP: (112-137)/(45-88) 137/51 mmHg (10/06 0809) SpO2:  [9 %-97 %] 95 % (10/06 0809) Weight:  [171 lb 1.2 oz (77.6 kg)] 171 lb 1.2 oz (77.6 kg) (10/06 0407) Last BM Date: 12/12/13  Intake/Output from previous day: 10/05 0701 - 10/06 0700 In: -  Out: 1000 [Urine:1000] Intake/Output this shift:    Medications Current Facility-Administered Medications  Medication Dose Route Frequency Provider Last Rate Last Dose  . acetaminophen (TYLENOL) tablet 650 mg  650 mg Oral Q4H PRN Leeann Must, MD   650 mg at 12/11/13 1457  . aspirin EC tablet 81 mg  81 mg Oral Daily Leeann Must, MD   81 mg at 12/16/13 1610  . atorvastatin (LIPITOR) tablet 40 mg  40 mg Oral q1800 Ok Anis, NP   40 mg at 12/15/13 1726  . azithromycin (ZITHROMAX) tablet 250 mg  250 mg Oral Daily Antoine Poche, MD   250 mg at 12/16/13 0926  . bisoprolol (ZEBETA) tablet 2.5 mg  2.5 mg Oral Daily Antoine Poche, MD   2.5 mg at 12/16/13 0926  . cholecalciferol (VITAMIN D) tablet 1,000 Units  1,000 Units Oral Daily Leeann Must, MD   1,000 Units at 12/16/13 [phone]  . citalopram (CELEXA) tablet 10 mg  10 mg Oral Daily Leeann Must, MD   10 mg at 12/16/13 0926  . fluticasone (FLONASE) 50 MCG/ACT nasal spray 2 spray  2 spray Each Nare Daily Leeann Must, MD   2 spray at 12/16/13 0927  . furosemide (LASIX) tablet 80 mg  80 mg Oral BID Tonny Bollman, MD   80 mg at 12/16/13 0926  . gi cocktail (Maalox,Lidocaine,Donnatal)  30  mL Oral BID PRN Leeann Must, MD      . guaiFENesin [facility]) 12 hr tablet 600 mg  600 mg Oral BID PRN Leeann Must, MD   600 mg at 12/14/13 2230  . heparin injection 5,000 Units  5,000 Units Subcutaneous 3 times per day Thurmon Fair, MD   5,000 Units at 12/16/13 5409  . HYDROcodone-acetaminophen (NORCO/VICODIN) 5-325 MG per tablet 1 tablet  1 tablet Oral Q6H PRN Leeann Must, MD      . ipratropium-albuterol (DUONEB) 0.5-2.5 (3) MG/3ML nebulizer solution 3 mL  3 mL Nebulization TID Thurmon Fair, MD   3 mL at 12/16/13 8119  . mirtazapine (REMERON) tablet 15 mg  15 mg Oral QHS PRN Leeann Must, MD   15 mg at 12/14/13 2230  . morphine 2 MG/ML injection 1 mg  1 mg Intravenous Q3H PRN Leeann Must, MD   1 mg at 12/15/13 2208  . multivitamin with minerals tablet 1 tablet  1 tablet Oral BID Leeann Must, MD   1 tablet at 12/16/13 [phone]  . ondansetron (ZOFRAN) injection 4 mg  4 mg Intravenous Q6H PRN Leeann Must, MD      . pantoprazole (PROTONIX) EC tablet 40 mg  40 mg Oral Daily Leeann Must, MD   40 mg at 12/16/13 0925  .  potassium chloride SA (K-DUR,KLOR-CON) CR tablet 20 mEq  20 mEq Oral Daily Dwana MelenaBryan W Hager, PA-C   20 mEq at 12/15/13 1227  . predniSONE (DELTASONE) tablet 10 mg  10 mg Oral Q breakfast Leeann MustJacob Kelly, MD   10 mg at 12/16/13 0926  . terbinafine (LAMISIL) 1 % cream   Topical BID Mihai Croitoru, MD      . vitamin C (ASCORBIC ACID) tablet 250 mg  250 mg Oral Daily Leeann MustJacob Kelly, MD   250 mg at 12/16/13 [phone]    PE: General appearance: alert, cooperative and no distress Lungs: diffuse bilateral rhonchi and wheezes. no rales Heart: irregularly irregular rhythm Extremities: no LEE Pulses: 2+ and symmetric Skin: warm and dry Neurologic: Grossly normal  Lab Results:  No results found for this basename: WBC, HGB, HCT, PLT,  in the last 72 hours BMET  Recent Labs  12/14/13 0313 12/15/13 0338 12/16/13 0317  NA 137 136* 140  K 3.7 4.6 4.7  CL 94* 93* 95*  CO2 29 26 27   GLUCOSE 99 110* 126*   BUN 50* 58* 59*  CREATININE 2.44* 2.50* 2.39*  CALCIUM 9.9 10.0 10.0    Assessment/Plan  Principal Problem:   Acute combined systolic and diastolic heart failure Active Problems:   Dyslipidemia   Coronary atherosclerosis of native coronary artery   Ischemic cardiomyopathy   Hypertension   GERD (gastroesophageal reflux disease)   Midsternal chest pain   CKD (chronic kidney disease), stage IV   COPD (chronic obstructive pulmonary disease)   1. Acute on chronic systolic + diastolic heart failure: appears euvolemic on physical exam. Dyspnea resolved. PO lasix was restarted yesterday. Continue daily potassium supplementation and low sodium diet. Continue BB. He is not an ACE/ARB candidate due to CKD. Unfortunately he has had orthostatic intolerance with hydralazine in past.   2. CAD: h/o CABG in 1995. Stable. Denies CP. Continue ASA, statin and BB.   3. Stage IV CKD: Scr improved in past 24 hrs after switch from IV to PO lasix. SCr today is 2.39 (down from 2.50).   4. COPD exacerbation: continue treatment per IM.      LOS: 6 days    Brittainy M. Sharol HarnessSimmons, PA-C 12/16/2013 10:18 AM  History and all data above reviewed.  Patient examined.  I agree with the findings as above.  No acute distress. He thinks that his breathing is close to baseline.  The patient exam reveals COR:RRR  ,  Lungs: Decreased breath sounds   ,  Abd: Positive bowel sounds, no rebound no guarding, Ext No edema  .  All available labs, radiology testing, previous records reviewed. Agree with documented assessment and plan. Acute on chronic HF:  Seems to be euvolemic.  I spoke with his wife and she says that he does like salt and we will limit this.  I spoke with OT.  He will be able to go home when he is discharged.  Perhaps discharge in the next 24 - 48 hours.  Continue current PO Lasix but probably back on Demadex (perhaps a slightly higher dose 40 mg daily) at discharge with close follow up of his creat.   Brett FearingJames  Ceaira Baldwin  11:16 AM  12/16/2013

## 2013-12-17 MED ORDER — TORSEMIDE 20 MG PO TABS: 40.0000 mg | ORAL_TABLET | Freq: Two times a day (BID) | ORAL | Status: DC

## 2013-12-17 MED ORDER — AZITHROMYCIN 250 MG PO TABS: ORAL_TABLET | ORAL | Status: DC

## 2013-12-17 MED ORDER — ALBUTEROL SULFATE (2.5 MG/3ML) 0.083% IN NEBU: 2.5000 mg | INHALATION_SOLUTION | RESPIRATORY_TRACT | Status: DC | PRN

## 2013-12-17 MED ORDER — ASPIRIN 81 MG PO TBEC: 81.0000 mg | DELAYED_RELEASE_TABLET | Freq: Every day | ORAL | Status: AC

## 2013-12-17 NOTE — Care Management Note (Signed)
    Page 1 of 2   12/17/2013     10:54:07 AM CARE MANAGEMENT NOTE 12/17/2013  Patient:  Brett Baldwin,Brett Baldwin   Account Number:  [id number]  Date Initiated:  12/15/2013  Documentation initiated by:  Haylin Camilli  Subjective/Objective Assessment:   dx systolic/diastolic failure w/EF <15%; lives with spouse, has walker    PCP  Dhruv Vyas     DC Planning Services  CM consult      Choice offered to / List presented to:  C-3 Spouse   DME arranged  WALKER      DME agency  [facility]    [facility] arranged  HH-1 RN  HH-10 DISEASE MANAGEMENT  HH-2 PT  HH-3 OT      [facility]  [facility]   Status of service:  Completed, signed off Medicare Important Message given?  YES (If response is "NO", the following Medicare IM given date fields will be blank) Date Medicare IM given:  12/15/2013 Medicare IM given by:  Donell Sliwinski Date Additional Medicare IM given:  12/17/13 Additional Medicare IM given by:  Keari Miu  Discharge Disposition:  HOME W HOME HEALTH SERVICES  Per UR Regulation:  Reviewed for med. necessity/level of care/duration of stay  If discussed at Long Length of Stay Meetings, dates discussed:   12/16/2013    Comments:  12/17/13 1015 Ceriah Kohler RN MSN BSN CCM Telemonitoring is through [employer] per information from spouse.  Pt will need home health RN, PT, OT. Provided spouse with list of agencies and [facility] to [facility] per choice.  Spouse declines [facility] as bathroom is very close to pt's bed, does want rollator.  12/15/13 1549 Ellese Julius RN MSN BSN CCM PT walked very short distance with pt and will re-eval tomorrow.  Tentative plan will be home with home health therapy.  Per spouse, pt active with agency for telemonitoring, she will determine name of agency.

## 2013-12-17 NOTE — Progress Notes (Signed)
    Subjective:  Breathing improved. Chronic cough unchanged. No chest pain or other complaints. Eager to go home.   Objective:  Vital Signs in the last 24 hours: Temp:  [97.4 F (36.3 C)-97.9 F (36.6 C)] 97.9 F (36.6 C) (10/07 0736) Pulse Rate:  [80-114] 92 (10/07 0736) Resp:  [17-23] 17 (10/07 0736) BP: (109-139)/(51-84) 110/71 mmHg (10/07 0736) SpO2:  [91 %-99 %] 96 % (10/07 0736) Weight:  [171 lb (77.565 kg)] 171 lb (77.565 kg) (10/07 0500)  Intake/Output from previous day: 10/06 0701 - 10/07 0700 In: 480 [P.O.:480] Out: 1025 [Urine:1025]  Physical Exam: Pt is alert and oriented, elderly male in NAD HEENT: normal Neck: JVP - mildly increased Lungs: prolonged expiration CV: RRR without murmur, distant Abd: soft, NT, Positive BS, no hepatomegaly Ext: no C/C/E Skin: warm/dry no rash   Lab Results: No results found for this basename: WBC, HGB, PLT,  in the last 72 hours  Recent Labs  12/15/13 0338 12/16/13 0317  NA 136* 140  K 4.6 4.7  CL 93* 95*  CO2 26 27  GLUCOSE 110* 126*  BUN 58* 59*  CREATININE 2.50* 2.39*   No results found for this basename: TROPONINI, CK, MB,  in the last 72 hours  Tele: Sinus with PVC's and couplets  Assessment/Plan:  1. Acute on chronic systolic heart failure, improved. Good diuresis here in the hospital and now back to baseline. Creatinine has stabilized. Will change to demadex 40 mg BID. Otherwise continue current medical therapy. As noted, orthostasis has limited CHF Rx. Needs to do daily weights, sodium restriction.  2. Stage IV CKD. Stable. BMET in one week.  3. CAD, native vessel. No anginal sx's. Continue current Rx.  4. COPD exacerbation. Complete course of azithromycin.  5. Dispo: appreciate PT/OT. Arrange home health RN/PT/OT. [place]   Tonny BollmanMichael Ferris Tally, M.D. 12/17/2013, 7:44 AM

## 2013-12-17 NOTE — Discharge Summary (Signed)
Physician Discharge Summary  Patient ID: Brett Baldwin MRN: [id number] DOB/AGE: June 03, 1923 80 y.o.  Primary Cardiologist: Dr. Wyline Mood  Admit date: 12/10/2013 Discharge date: 12/17/2013  Admission Diagnoses: Acute Combined Systolic and Diastolic HF  Discharge Diagnoses:  Principal Problem:   Acute combined systolic and diastolic heart failure Active Problems:   Dyslipidemia   Coronary atherosclerosis of native coronary artery   Ischemic cardiomyopathy   Hypertension   GERD (gastroesophageal reflux disease)   Midsternal chest pain   CKD (chronic kidney disease), stage IV   COPD (chronic obstructive pulmonary disease)   Discharged Condition: stable  Hospital Course: [name] is an 80 yo man with PMH of CAD and CABG '95 @ [facility] with last known EF <20%, restrictive diastolic dysfunction who initially presented to [facility] with dyspnea/shortness of breath, cough and chest discomfort. He received aspirin 324 mg (4 pills) and was eventually started on heparin for an elevated troponin and transferred to [facility] for further care. Initial troponin was 0.007. Subsequent troponins at [facility] were negative x 2. D-dimer was elevated. V/Q scan was low risk for PE. BNP was elevated at 6667.0. CXR was consistent with acute CHF with cardiomegaly and pulmonary vascular congestion and interstitial  edema.  He was treated for acute on chronic systolic + diastolic CHF. He was placed on IV diuretics and had significant improvement. A 2D echo revealed EF at 25-30% and grade I diastolic dysfunction. There was noted to be some increased echogenicity at apex, however the study was repeated using contrast and was negative for thrombus. He was also noted to have a productive cough and elevated WBC (CXR negative for infiltrate). Acute COPD exacerbation was felt likely. He was started on azithromycin and white count and symptoms improved. He had no other issues. After diuresis he was  transitioned back to PO diuretics and remained stable. His home dose of Torsemide was increased from 40 mg once daily to twice daily. He denied further dyspnea and CP. He was evaluated by PT who recommended home health RN, PT and OT. He was last seen and examined by Dr. Excell Seltzer, who determined he was stable for discharge home. Dr. Excell Seltzer recommended a f/u BMET to reassess renal function and electrolytes in 1 week. F/u has been arranged in [place] on 12/24/13. Home health services were arranged.    Consults: None  Significant Diagnostic Studies:  V/Q scan 12/11/13 ADDENDUM REPORT: 12/11/2013 19:47  ADDENDUM:  The patient's physician states that she has low clinical suspicion  of pulmonary embolus. This information, as well as reconsideration  of the significance of the defect seen involving the superior  segment of left lower lobe, suggests low probability of pulmonary  embolus.   CXR 12/11/13 FINDINGS:  Grossly unchanged enlarged cardiac silhouette and mediastinal  contours with atherosclerotic plaque within the thoracic aorta. Post  median sternotomy. Overall improved aeration of the lungs. Unchanged  mild eventration of the right hemidiaphragm. No pleural effusion or  pneumothorax. No evidence of edema. Grossly unchanged focal kyphosis  involving the lower thoracic spine. Regional soft tissues appear  normal.  IMPRESSION:  Improved aeration along suggests resolved edema and atelectasis.  2D echo 12/12/13  Study Conclusions  - Left ventricle: LVEF is severely depressed at apprxoiatmey 25 to 30% There is some increased echogenicity at apex, cannot rule out thrombus Recomm limited echo with contrast. The cavity size was severely dilated. Wall thickness was normal. Doppler parameters are consistent with abnormal left ventricular relaxation (grade 1 diastolic dysfunction). - Aortic valve:  There was mild regurgitation. - Left atrium: The atrium was mildly dilated.  Limited 2D echo  with contrast 12/13/13 Impressions:  - Limited study shows severely depressed LV systolic function, EF<15%, global hypokinesis. There is no LV thrombus.   Treatments: See Hospital Course  Discharge Exam: Blood pressure 105/85, pulse 99, temperature 97.8 F (36.6 C), temperature source Oral, resp. rate 17, height 5\' 7"  (1.702 m), weight 171 lb (77.565 kg), SpO2 97.00%.   Disposition: 01-Home or Self Care      Discharge Instructions   Diet - low sodium heart healthy    Complete by:  As directed      Discharge instructions    Complete by:  As directed   Eat a low sodium (salt) diet. You will need to get blood work in 1 week prior to your appointment. 0     Face-to-face encounter (required for Medicare/Medicaid patients)    Complete by:  As directed   I SIMMONS, BRITTAINY certify that this patient is under my care and that I, or a nurse practitioner or physician's assistant working with me, had a face-to-face encounter that meets the physician face-to-face encounter requirements with this patient on 12/17/2013. The encounter with the patient was in whole, or in part for the following medical condition(s) which is the primary reason for home health care. Poor balance, difficulty ambulating.  The encounter with the patient was in whole, or in part, for the following medical condition, which is the primary reason for home health care:  difficulty with ambulation  I certify that, based on my findings, the following services are medically necessary home health services:   Nursing Physical therapy    My clinical findings support the need for the above services:  Unsafe ambulation due to balance issues  Further, I certify that my clinical findings support that this patient is homebound due to:  Unsafe ambulation due to balance issues  Reason for Medically Necessary Home Health Services:  Therapy- Therapeutic Exercises to Increase Strength and Endurance     Home Health    Complete by:  As  directed   To provide the following care/treatments:   PT OT RN       Increase activity slowly    Complete by:  As directed             Medication List    STOP taking these medications       HYDROcodone-acetaminophen 5-325 MG per tablet  Commonly known as:  NORCO/VICODIN      TAKE these medications       aspirin 81 MG EC tablet  Take 1 tablet (81 mg total) by mouth daily.     azithromycin 250 MG tablet  Commonly known as:  ZITHROMAX  Once daily     B-12 PO  Take 1 tablet by mouth daily.     bisoprolol-hydrochlorothiazide 5-6.25 MG per tablet  Commonly known as:  ZIAC  Take 1 tablet by mouth daily.     cholecalciferol 1000 UNITS tablet  Commonly known as:  VITAMIN D  Take 1,000 Units by mouth daily.     citalopram 10 MG tablet  Commonly known as:  CELEXA  Take 10 mg by mouth daily.     fluticasone 50 MCG/ACT nasal spray  Commonly known as:  FLONASE  Place 2 sprays into both nostrils daily.     mirtazapine 15 MG tablet  Commonly known as:  REMERON  Take 15 mg by mouth at bedtime.  MUCINEX PO  Take 1 tablet by mouth 2 (two) times daily as needed (For cough).     multivitamin tablet  Take 1 tablet by mouth 2 (two) times daily.     pantoprazole 40 MG tablet  Commonly known as:  PROTONIX  Take 40 mg by mouth daily.     predniSONE 1 MG tablet  Commonly known as:  DELTASONE  - Take 1 mg by mouth daily with breakfast. 4mg  x1qweek  - 3mg  x1qweek  - 2mg  x1 qweek  - 1mg  x1 qweek then stop     simvastatin 40 MG tablet  Commonly known as:  ZOCOR  Take 40 mg by mouth daily.     torsemide 20 MG tablet  Commonly known as:  DEMADEX  Take 2 tablets (40 mg total) by mouth 2 (two) times daily.     VENTOLIN HFA 108 (90 BASE) MCG/ACT inhaler  Generic drug:  albuterol  Inhale 2 puffs into the lungs every 4 (four) hours as needed.       Follow-up Information   Follow up with Jacolyn ReedyMichele Lenze, PA-C On 12/24/2013. (11:20 am (Dr. Verna CzechBranch's PA))    Specialty:   Cardiology   Contact information:   [address] [place] [phone]        TIME SPENT ON DISCHARGE, INCLUDING PHYSICIAN TIME: >30 MINUTES  Signed: Robbie LisSIMMONS, BRITTAINY 12/17/2013, 1:33 PM

## 2013-12-18 ENCOUNTER — Telehealth: Payer: Self-pay | Admitting: *Deleted

## 2013-12-19 NOTE — Telephone Encounter (Signed)
Patient is unable to come to the telephone and had to speak with his wife.  [facility]  Patient understands to follow up with Wynell BalloonMichelle Lenz on 12/24/13 at 11:20 in the [place] office. Patient understands discharge instructions? yes Patient understands medications and regiment? yes Patient understands to bring all medications to this visit? yes  Patients wife states that is not eating as much and does not have much energy.

## 2013-12-23 ENCOUNTER — Telehealth: Payer: Self-pay | Admitting: Cardiology

## 2013-12-23 NOTE — Telephone Encounter (Signed)
New Message  [facility]bal orders for occupational therapy and frequency in twice a week for 3 weeks followed by once a week for one week. Please call

## 2013-12-24 ENCOUNTER — Telehealth: Payer: Self-pay | Admitting: *Deleted

## 2013-12-24 ENCOUNTER — Ambulatory Visit (INDEPENDENT_AMBULATORY_CARE_PROVIDER_SITE_OTHER): Payer: Medicare Other | Admitting: Physician Assistant

## 2013-12-24 ENCOUNTER — Encounter: Payer: Self-pay | Admitting: Physician Assistant

## 2013-12-24 VITALS — BP 142/52 | HR 60 | Ht 67.0 in | Wt 166.0 lb

## 2013-12-24 DIAGNOSIS — I10 Essential (primary) hypertension: Secondary | ICD-10-CM

## 2013-12-24 DIAGNOSIS — I5023 Acute on chronic systolic (congestive) heart failure: Secondary | ICD-10-CM

## 2013-12-24 DIAGNOSIS — N184 Chronic kidney disease, stage 4 (severe): Secondary | ICD-10-CM

## 2013-12-24 DIAGNOSIS — J441 Chronic obstructive pulmonary disease with (acute) exacerbation: Secondary | ICD-10-CM

## 2013-12-24 LAB — BASIC METABOLIC PANEL WITH GFR
BUN: 85 mg/dL — ABNORMAL HIGH (ref 6–23)
CHLORIDE: 93 meq/L — AB (ref 96–112)
CO2: 27 meq/L (ref 19–32)
Calcium: 10.1 mg/dL (ref 8.4–10.5)
Creat: 3.31 mg/dL — ABNORMAL HIGH (ref 0.50–1.35)
GFR, EST NON AFRICAN AMERICAN: 16 mL/min — AB
GFR, Est African American: 18 mL/min — ABNORMAL LOW
Glucose, Bld: 139 mg/dL — ABNORMAL HIGH (ref 70–99)
POTASSIUM: 3 meq/L — AB (ref 3.5–5.3)
SODIUM: 137 meq/L (ref 135–145)

## 2013-12-24 NOTE — Telephone Encounter (Signed)
Forwarded to The First AmericanMichelle Lenze FYI

## 2013-12-24 NOTE — Telephone Encounter (Signed)
Message copied by Vernon PreyBARKER, Leonna Schlee T on Wed Dec 24, 2013  3:16 PM ------      Message from: [facility] JohnstownMOORE, Corrie MckusickLISA O      Created: Wed Dec 24, 2013  2:27 PM      Regarding: RE: Order for [facility] Orth[facility] InstituteBLANKENSHIP,Alyssa       Unfortunately, [facility] does not have a contract with [employer].  Please notify our office if you find the patient is covered under a different Medicare plan.            Please call our office with any questions [phone].            Thanks,      Nena PolioLisa Moore      [facility] CM Assistant            ----- Message -----         From: Vernon PreyStaci T Barker, CMA         Sent: 12/24/2013  11:42 AM           To: [phone]      Subject: Order for Alyson LocketBLANKENSHIP,Drevon                                   Patient Name: Jasmine AweBLANKENSHIP,Rashard([id number])      Sex: Male      DOB: March 22, 1923              PCP: Doreen BeamVYAS, DHRUV B.        [facility]             Types of orders made on 12/24/2013: ECG, Lab, Medications, Nursing            Order Date:12/24/2013      Ordering Kem BoroughsUser:BARKER, Merland Holness T [[id number]]      Encounter Provider:Michele Leda GauzeM Lenze, PA-C [[id number]]      Authorizing Provider: Dyann KiefMichele M Lenze, PA-C [[id number]]      Department:[facility][[id number]]            Order Specific Information      Order: Consult to [facility] [Custom: [id number]]  Order #: [id number]               Qty: 1        Priority: Routine  Class: Clinic Performed        Resulting Agency: [facility]        Associated Diagnoses          I10 Essential hypertension          Reason for consult -> help with care                 Expected date of contact -> within 10 days                 Diagnoses of -> Heart Failure                           Priority: Routine  Class: Clinic Performed        Resulting Agency: [facility]        Associated Diagnoses          I10 Essential hypertension          Reason for consult -> help with care                 Expected date of contact -> within 10 days  Diagnoses of -> Heart Failure                    ------

## 2013-12-24 NOTE — Progress Notes (Signed)
HPI: This is a 80 year old male patient Dr. Wyline MoodBranch who was admitted to the hospital with acute on chronic combined systolic and diastolic heart failure. He has a history of CAD and [facility]. 2-D echo on 12/12/13 EF was 25-30% with some increased echogenicity in the apex can't rule out thrombus. He had grade 1 diastolic dysfunction mild AI. Limited 2-D echo with contrast in 12/13/13 EF less than 15% with global hypokinesis there is no LV thrombus. He was also given antibiotics for acute COPD exacerbation. His torsemide was increased from 40 mg once daily to twice daily.  The patient has not done well since discharge. His wife has given him an extra Lasix twice for 3 pound weight gain and increase shortness of breath. He can hardly walk with his walker to the bathroom. He complains of dyspnea with very little activity and is very weak. His blood pressure runs low although it is up today. He has home health nurse, PT and OT working with him. His wife is exhausted trying to take care of him. He is getting excess of salt. She fed him bacon for breakfast this morning.  No Known Allergies   Current Outpatient Prescriptions  Medication Sig Dispense Refill  . aspirin EC 81 MG EC tablet Take 1 tablet (81 mg total) by mouth daily.      . bisoprolol-hydrochlorothiazide (ZIAC) 5-6.25 MG per tablet Take 1 tablet by mouth daily.      . cholecalciferol (VITAMIN D) 1000 UNITS tablet Take 1,000 Units by mouth daily.      . citalopram (CELEXA) 10 MG tablet Take 10 mg by mouth daily.      . Cyanocobalamin (B-12 PO) Take 1 tablet by mouth daily.      . fluticasone (FLONASE) 50 MCG/ACT nasal spray Place 2 sprays into both nostrils daily.      . GuaiFENesin (MUCINEX PO) Take 1 tablet by mouth 2 (two) times daily as needed (For cough).       . mirtazapine (REMERON) 15 MG tablet Take 15 mg by mouth at bedtime.       . Multiple Vitamin (MULTIVITAMIN) tablet Take 1 tablet by mouth 2 (two)  times daily.      . pantoprazole (PROTONIX) 40 MG tablet Take 40 mg by mouth daily.      . Potassium Chloride (KLOR-CON PO) Take by mouth.      . predniSONE (DELTASONE) 1 MG tablet Take 1 mg by mouth daily with breakfast. 4mg  x1qweek 3mg  x1qweek 2mg  x1 qweek 1mg  x1 qweek then stop      . simvastatin (ZOCOR) 40 MG tablet Take 40 mg by mouth daily.       Marland Kitchen. torsemide (DEMADEX) 20 MG tablet Take 2 tablets (40 mg total) by mouth 2 (two) times daily.  120 tablet  5  . VENTOLIN HFA 108 (90 BASE) MCG/ACT inhaler Inhale 2 puffs into the lungs every 4 (four) hours as needed.       No current facility-administered medications for this visit.    Past Medical History  Diagnosis Date  . Hypertension   . History of pulmonary embolism     No pulmonary embolus on the current CT scan as of 03/01/2009  . Carotid artery stenosis      Status post CT of head and neck. Right common carotid artery and right internal carotid artery were occluded not contiguous string sign detected. Prominent irregular calcified plaque noted in the left carotid  artery with an estimated 60% diameter stenosis. Also 75% stenosis in the proximal vertebral artery..  . COPD (chronic obstructive pulmonary disease)   . Pulmonary nodules   . Swallowing difficulty   . CAD (coronary artery disease)     [facility].  . Ischemic cardiomyopathy   . Chronic systolic heart failure   . CKD (chronic kidney disease), stage IV   . Weight loss     Past Surgical History  Procedure Laterality Date  . Cardiac catheterization  2007    revealing patent grafts  . Coronary artery bypass graft  1995  . Prostate surgery      Hx of bypass prostate infections    Family History  Problem Relation Age of Onset  . Heart disease Neg Hx   . Ataxia Neg Hx   . Chorea Neg Hx   . Dementia Neg Hx   . Mental retardation Neg Hx   . Migraines Neg Hx   . Multiple sclerosis Neg Hx   . Neurofibromatosis Neg Hx     . Neuropathy Neg Hx   . Parkinsonism Neg Hx   . Seizures Neg Hx   . Stroke Neg Hx     History   Social History  . Marital Status: Married    Spouse Name: N/A    Number of Children: N/A  . Years of Education: N/A   Occupational History  . Not on file.   Social History Main Topics  . Smoking status: Former Smoker -- 1.00 packs/day for 35 years    Types: Cigarettes    Start date: 04/03/1941    Quit date: 08/13/1976  . Smokeless tobacco: Never Used  . Alcohol Use: No  . Drug Use: No  . Sexual Activity: Not on file   Other Topics Concern  . Not on file   Social History Narrative   Married    ROS: See history of present illness  BP 142/52  Pulse 60  Ht 5\' 7"  (1.702 m)  Wt 166 lb (75.297 kg)  BMI 25.99 kg/m2  PHYSICAL EXAM: Elderly, in no acute distress. Neck: No JVD, HJR, Bruit, or thyroid enlargement  Lungs: Decreased breath sounds but No tachypnea, clear without wheezing, rales, or rhonchi  Cardiovascular: RRR, PMI not displaced, heart sounds distant, no murmurs, gallops, bruit, thrill, or heave.  Abdomen: BS normal. Soft without organomegaly, masses, lesions or tenderness.  Extremities: without cyanosis, clubbing or edema. Good distal pulses bilateral  SKin: Warm, no lesions or rashes   Musculoskeletal: No deformities  Neuro: no focal signs   Wt Readings from Last 3 Encounters:  12/24/13 166 lb (75.297 kg)  12/17/13 171 lb (77.565 kg)  11/27/13 177 lb (80.287 kg)     EKG: Normal sinus rhythm with frequent PVCs

## 2013-12-24 NOTE — Patient Instructions (Signed)
Your physician recommends that you schedule a follow-up appointment in:1- 2 weeks with Dr. Wyline MoodBranch.  Your physician recommends that you return for lab work now. BMP  [facility] They will call you within 10 days.  Your physician recommends that you continue on your current medications as directed. Please refer to the Current Medication list given to you today.   Thank you for choosing [facility]!!

## 2013-12-24 NOTE — Assessment & Plan Note (Signed)
Patient is having ongoing problems controlling his heart failure. He's had to take extra Lasix twice for 3 pound weight gain and shortness of breath. He is been on a downward spiral for the past 2 months. His wife is having trouble taking care of him. I suspect he may need hospice in the near future. Ejection fraction 15%. We'll check be met today. Continue current treatment with Lasix twice a day and when necessary for weight gain and edema. He is getting extra salt so have instructed him on 2 g sodium diet. Followup with Dr. Branch in one to 2 weeks for furthe discussion on long-term care.r 

## 2013-12-24 NOTE — Assessment & Plan Note (Signed)
Check be met today, followup with renal

## 2013-12-24 NOTE — Assessment & Plan Note (Signed)
Blood pressure is stable today but has been running low home. Cannot add medication such as Lynne LoganErnesto.

## 2013-12-24 NOTE — Assessment & Plan Note (Signed)
Currently on a steroid taper

## 2013-12-25 ENCOUNTER — Encounter: Payer: Self-pay | Admitting: *Deleted

## 2013-12-25 ENCOUNTER — Telehealth: Payer: Self-pay | Admitting: *Deleted

## 2013-12-25 ENCOUNTER — Other Ambulatory Visit: Payer: Self-pay | Admitting: *Deleted

## 2013-12-25 DIAGNOSIS — E876 Hypokalemia: Secondary | ICD-10-CM

## 2013-12-25 MED ORDER — POTASSIUM CHLORIDE CRYS ER 20 MEQ PO TBCR: 20.0000 meq | EXTENDED_RELEASE_TABLET | Freq: Every day | ORAL | Status: DC

## 2013-12-25 NOTE — Telephone Encounter (Signed)
Brett Baldwin  DOB: Feb 26, 1924  MRN: [id number]   Ok to have Bedside commode per Dr. Antoine PocheHochrein  Order faxed to attn. of Myrene BuddyYvonne  Date of order 12/25/2013

## 2013-12-25 NOTE — Telephone Encounter (Signed)
[facility] OT and a bedside commode for this patient

## 2013-12-25 NOTE — Telephone Encounter (Signed)
Message copied by Vernon PreyBARKER, Mads Borgmeyer T on Thu Dec 25, 2013  8:22 AM ------      Message from: Dyann KiefLENZE, MICHELE M      Created: Thu Dec 25, 2013  8:07 AM       Low potassium. Add Kdur 20meq twice a day. Renal function much worse. Avoid taking extra Demadex. Needs to see renal dr. Ola SpurrAsap. Repeat bmet next week and see Dr. Wyline MoodBranch next week ------

## 2013-12-25 NOTE — Telephone Encounter (Signed)
Called mallory no answer. LMTCB

## 2013-12-25 NOTE — Telephone Encounter (Signed)
Pt wife made aware will pick up KCl and repeat labs next Tuesday. Sees Dr. Wyline MoodBranch on 10/28 and renal dr appt is 11/4. Will forward labs to renal DR and Dr. Sherril CroonVyas.

## 2013-12-25 NOTE — Telephone Encounter (Signed)
Ok for all orders 

## 2013-12-26 ENCOUNTER — Telehealth: Payer: Self-pay | Admitting: Cardiology

## 2013-12-26 NOTE — Telephone Encounter (Signed)
[facility]ed [facility] she just wanted Dr.Branch to know patient lost balance and fell 12/22/13 with no injuries.Stated she saw him today and he was doing well.

## 2013-12-26 NOTE — Telephone Encounter (Signed)
The pt had a fall on 12-22-13 with no apparent injuries. This is just a FYI.

## 2013-12-30 ENCOUNTER — Emergency Department (HOSPITAL_COMMUNITY): Payer: Medicare Other

## 2013-12-30 ENCOUNTER — Telehealth: Payer: Self-pay

## 2013-12-30 ENCOUNTER — Encounter (HOSPITAL_COMMUNITY): Payer: Self-pay | Admitting: Emergency Medicine

## 2013-12-30 ENCOUNTER — Inpatient Hospital Stay (HOSPITAL_COMMUNITY)
Admission: EM | Admit: 2013-12-30 | Discharge: 2014-01-02 | DRG: 190 | Disposition: A | Discharge status: Hospice | Payer: Medicare Other | Attending: Internal Medicine | Admitting: Internal Medicine

## 2013-12-30 DIAGNOSIS — Z79899 Other long term (current) drug therapy: Secondary | ICD-10-CM

## 2013-12-30 DIAGNOSIS — N184 Chronic kidney disease, stage 4 (severe): Secondary | ICD-10-CM

## 2013-12-30 DIAGNOSIS — I5023 Acute on chronic systolic (congestive) heart failure: Secondary | ICD-10-CM

## 2013-12-30 DIAGNOSIS — Z87891 Personal history of nicotine dependence: Secondary | ICD-10-CM | POA: Diagnosis not present

## 2013-12-30 DIAGNOSIS — Z951 Presence of aortocoronary bypass graft: Secondary | ICD-10-CM

## 2013-12-30 DIAGNOSIS — Z86711 Personal history of pulmonary embolism: Secondary | ICD-10-CM | POA: Diagnosis not present

## 2013-12-30 DIAGNOSIS — F329 Major depressive disorder, single episode, unspecified: Secondary | ICD-10-CM | POA: Diagnosis present

## 2013-12-30 DIAGNOSIS — N179 Acute kidney failure, unspecified: Secondary | ICD-10-CM | POA: Diagnosis present

## 2013-12-30 DIAGNOSIS — I10 Essential (primary) hypertension: Secondary | ICD-10-CM

## 2013-12-30 DIAGNOSIS — E861 Hypovolemia: Secondary | ICD-10-CM | POA: Diagnosis present

## 2013-12-30 DIAGNOSIS — I255 Ischemic cardiomyopathy: Secondary | ICD-10-CM

## 2013-12-30 DIAGNOSIS — R296 Repeated falls: Secondary | ICD-10-CM | POA: Diagnosis present

## 2013-12-30 DIAGNOSIS — Z7982 Long term (current) use of aspirin: Secondary | ICD-10-CM | POA: Diagnosis not present

## 2013-12-30 DIAGNOSIS — I2510 Atherosclerotic heart disease of native coronary artery without angina pectoris: Secondary | ICD-10-CM | POA: Diagnosis present

## 2013-12-30 DIAGNOSIS — E785 Hyperlipidemia, unspecified: Secondary | ICD-10-CM

## 2013-12-30 DIAGNOSIS — K219 Gastro-esophageal reflux disease without esophagitis: Secondary | ICD-10-CM

## 2013-12-30 DIAGNOSIS — E876 Hypokalemia: Secondary | ICD-10-CM

## 2013-12-30 DIAGNOSIS — I129 Hypertensive chronic kidney disease with stage 1 through stage 4 chronic kidney disease, or unspecified chronic kidney disease: Secondary | ICD-10-CM | POA: Diagnosis present

## 2013-12-30 DIAGNOSIS — Z66 Do not resuscitate: Secondary | ICD-10-CM | POA: Diagnosis present

## 2013-12-30 DIAGNOSIS — I5041 Acute combined systolic (congestive) and diastolic (congestive) heart failure: Secondary | ICD-10-CM

## 2013-12-30 DIAGNOSIS — I4891 Unspecified atrial fibrillation: Secondary | ICD-10-CM

## 2013-12-30 DIAGNOSIS — Z7952 Long term (current) use of systemic steroids: Secondary | ICD-10-CM

## 2013-12-30 DIAGNOSIS — J410 Simple chronic bronchitis: Secondary | ICD-10-CM

## 2013-12-30 DIAGNOSIS — J441 Chronic obstructive pulmonary disease with (acute) exacerbation: Secondary | ICD-10-CM | POA: Diagnosis present

## 2013-12-30 DIAGNOSIS — I959 Hypotension, unspecified: Secondary | ICD-10-CM

## 2013-12-30 DIAGNOSIS — R0602 Shortness of breath: Secondary | ICD-10-CM

## 2013-12-30 HISTORY — DX: Essential (primary) hypertension: I10

## 2013-12-30 HISTORY — DX: Atherosclerotic heart disease of native coronary artery without angina pectoris: I25.10

## 2013-12-30 LAB — BASIC METABOLIC PANEL
Anion gap: 17 — ABNORMAL HIGH (ref 5–15)
BUN: 63 mg/dL — ABNORMAL HIGH (ref 6–23)
CHLORIDE: 96 meq/L (ref 96–112)
CO2: 27 meq/L (ref 19–32)
Calcium: 8.6 mg/dL (ref 8.4–10.5)
Creatinine, Ser: 2.57 mg/dL — ABNORMAL HIGH (ref 0.50–1.35)
GFR calc Af Amer: 24 mL/min — ABNORMAL LOW (ref >90)
GFR, EST NON AFRICAN AMERICAN: 21 mL/min — AB (ref >90)
GLUCOSE: 119 mg/dL — AB (ref 70–99)
Potassium: 3.4 mEq/L — ABNORMAL LOW (ref 3.7–5.3)
SODIUM: 140 meq/L (ref 137–147)

## 2013-12-30 LAB — CBC WITH DIFFERENTIAL/PLATELET
Basophils Absolute: 0 10*3/uL (ref 0.0–0.1)
Basophils Relative: 1 % (ref 0–1)
Eosinophils Absolute: 0.7 10*3/uL (ref 0.0–0.7)
Eosinophils Relative: 8 % — ABNORMAL HIGH (ref 0–5)
HCT: 36.1 % — ABNORMAL LOW (ref 39.0–52.0)
HEMOGLOBIN: 12.4 g/dL — AB (ref 13.0–17.0)
LYMPHS ABS: 1.8 10*3/uL (ref 0.7–4.0)
LYMPHS PCT: 20 % (ref 12–46)
MCH: 31.6 pg (ref 26.0–34.0)
MCHC: 34.3 g/dL (ref 30.0–36.0)
MCV: 91.9 fL (ref 78.0–100.0)
Monocytes Absolute: 0.8 10*3/uL (ref 0.1–1.0)
Monocytes Relative: 9 % (ref 3–12)
NEUTROS ABS: 5.6 10*3/uL (ref 1.7–7.7)
NEUTROS PCT: 62 % (ref 43–77)
Platelets: 193 10*3/uL (ref 150–400)
RBC: 3.93 MIL/uL — AB (ref 4.22–5.81)
RDW: 16.1 % — ABNORMAL HIGH (ref 11.5–15.5)
WBC: 8.8 10*3/uL (ref 4.0–10.5)

## 2013-12-30 LAB — BASIC METABOLIC PANEL WITH GFR
BUN: 71 mg/dL — ABNORMAL HIGH (ref 6–23)
CO2: 27 meq/L (ref 19–32)
Calcium: 8.2 mg/dL — ABNORMAL LOW (ref 8.4–10.5)
Chloride: 97 mEq/L (ref 96–112)
Creat: 2.8 mg/dL — ABNORMAL HIGH (ref 0.50–1.35)
GFR, Est African American: 22 mL/min — ABNORMAL LOW
GFR, Est Non African American: 19 mL/min — ABNORMAL LOW
GLUCOSE: 149 mg/dL — AB (ref 70–99)
POTASSIUM: 3.2 meq/L — AB (ref 3.5–5.3)
SODIUM: 140 meq/L (ref 135–145)

## 2013-12-30 LAB — PRO B NATRIURETIC PEPTIDE: Pro B Natriuretic peptide (BNP): 5263 pg/mL — ABNORMAL HIGH (ref 0–450)

## 2013-12-30 LAB — TROPONIN I

## 2013-12-30 MED ORDER — ACETAMINOPHEN 325 MG PO TABS
650.0000 mg | ORAL_TABLET | Freq: Once | ORAL | Status: AC
  Administered 2013-12-30: 650 mg via ORAL
  Filled 2013-12-30: qty 2

## 2013-12-30 MED ORDER — METHYLPREDNISOLONE SODIUM SUCC 125 MG IJ SOLR
125.0000 mg | Freq: Once | INTRAMUSCULAR | Status: AC
  Administered 2013-12-30: 125 mg via INTRAVENOUS
  Filled 2013-12-30: qty 2

## 2013-12-30 MED ORDER — ALBUTEROL SULFATE (2.5 MG/3ML) 0.083% IN NEBU
2.5000 mg | INHALATION_SOLUTION | RESPIRATORY_TRACT | Status: DC | PRN
Start: 2013-12-30 — End: 2014-01-02
  Administered 2013-12-30: 2.5 mg via RESPIRATORY_TRACT
  Filled 2013-12-30: qty 3

## 2013-12-30 MED ORDER — POTASSIUM CHLORIDE CRYS ER 20 MEQ PO TBCR: EXTENDED_RELEASE_TABLET | ORAL | Status: DC

## 2013-12-30 MED ORDER — ACETAMINOPHEN 325 MG PO TABS: 650.0000 mg | ORAL_TABLET | Freq: Once | ORAL | Status: DC

## 2013-12-30 MED ORDER — SODIUM CHLORIDE 0.9 % IV BOLUS (SEPSIS)
250.0000 mL | Freq: Once | INTRAVENOUS | Status: AC
  Administered 2013-12-30: 250 mL via INTRAVENOUS

## 2013-12-30 MED ORDER — SODIUM CHLORIDE 0.9 % IV SOLN
INTRAVENOUS | Status: AC
  Administered 2013-12-30: via INTRAVENOUS

## 2013-12-30 MED ORDER — METOLAZONE 2.5 MG PO TABS: ORAL_TABLET | ORAL | Status: DC

## 2013-12-30 MED ORDER — SODIUM CHLORIDE 0.9 % IV SOLN: Freq: Once | INTRAVENOUS | Status: DC

## 2013-12-30 NOTE — Telephone Encounter (Signed)
Spoke with wife,she will increase potassium as directed and repeat blood work in 1 week

## 2013-12-30 NOTE — Telephone Encounter (Signed)
Spoke with wife,she will give Metolazone 2.5 mg today and tomorrow and then stop.Has fu tomorrow with Dr.Branch     Spoke with pharmacy,co-pay is same for 30 tabs vs just 2 tabs, so # 30 dispensed if needed in future

## 2013-12-30 NOTE — ED Provider Notes (Addendum)
CSN: [id number]     Arrival date & time 12/30/13  1920 History   First MD Initiated Contact with Patient 12/30/13 1930     Chief Complaint  Patient presents with  . Shortness of Breath      HPI  Patient presents with chief complaint shortness of breath.  Discharge on the 9th after 10 day hospitalization at [facility].  Admitted with dyspnea, chf, copd exacerbation.  Echo with EF of 15%.  Since that time he is had continued episodes of dyspnea. His wife has communicated with his physicians considerably over the phone. His torsemide was increased during hospitalization.Edd Fabian. Zaroxolyn was called in yesterday. Is on HCTZ (with Bisoprolol) regularly.  Presents today with progressive shortness of breath since noon today. Denies any chest pain. Described a tightness in his chest. No pain or pressure. Dry nonproductive cough. States he felt dizzy lightheaded and weak and thus presents here.  Past Medical History  Diagnosis Date  . Hypertension   . History of pulmonary embolism     No pulmonary embolus on the current CT scan as of 03/01/2009  . Carotid artery stenosis      Status post CT of head and neck. Right common carotid artery and right internal carotid artery were occluded not contiguous string sign detected. Prominent irregular calcified plaque noted in the left carotid artery with an estimated 60% diameter stenosis. Also 75% stenosis in the proximal vertebral artery..  . COPD (chronic obstructive pulmonary disease)   . Pulmonary nodules   . Swallowing difficulty   . CAD (coronary artery disease)     Coron[facility] bypass [facility] 1995 [facility].  . Ischemic cardiomyopathy   . Chronic systolic heart failure   . CKD (chronic kidney disease), stage IV   . Weight loss    Past Surgical History  Procedure Laterality Date  . Cardiac catheterization  2007    revealing patent grafts  . Coronary artery bypass graft  1995  . Prostate surgery      Hx of bypass prostate infections    Family History  Problem Relation Age of Onset  . Heart disease Neg Hx   . Ataxia Neg Hx   . Chorea Neg Hx   . Dementia Neg Hx   . Mental retardation Neg Hx   . Migraines Neg Hx   . Multiple sclerosis Neg Hx   . Neurofibromatosis Neg Hx   . Neuropathy Neg Hx   . Parkinsonism Neg Hx   . Seizures Neg Hx   . Stroke Neg Hx    History  Substance Use Topics  . Smoking status: Former Smoker -- 1.00 packs/day for 35 years    Types: Cigarettes    Start date: 04/03/1941    Quit date: 08/13/1976  . Smokeless tobacco: Never Used  . Alcohol Use: No    Review of Systems  Constitutional: Negative for fever, chills, diaphoresis, appetite change and fatigue.  HENT: Negative for mouth sores, sore throat and trouble swallowing.   Eyes: Negative for visual disturbance.  Respiratory: Positive for chest tightness and shortness of breath. Negative for cough and wheezing.   Cardiovascular: Negative for chest pain, palpitations and leg swelling.  Gastrointestinal: Negative for nausea, vomiting, abdominal pain, diarrhea and abdominal distention.  Endocrine: Negative for polydipsia, polyphagia and polyuria.  Genitourinary: Negative for dysuria, frequency and hematuria.  Musculoskeletal: Negative for gait problem.  Skin: Negative for color change, pallor and rash.  Neurological: Negative for dizziness, syncope, light-headedness and headaches.  Hematological: Does  not bruise/bleed easily.  Psychiatric/Behavioral: Negative for behavioral problems and confusion.      Allergies  Review of patient's allergies indicates no known allergies.  Home Medications   Prior to Admission medications   Medication Sig Start Date End Date Taking? Authorizing Provider  aspirin EC 81 MG EC tablet Take 1 tablet (81 mg total) by mouth daily. 12/17/13  Yes Brittainy Sherlynn CarbonM Simmons, PA-C  bisoprolol-hydrochlorothiazide (ZIAC) 5-6.25 MG per tablet Take 1 tablet by mouth daily.   Yes Historical Provider, MD   cholecalciferol (VITAMIN D) 1000 UNITS tablet Take 1,000 Units by mouth every evening.    Yes Historical Provider, MD  citalopram (CELEXA) 10 MG tablet Take 10 mg by mouth every evening.    Yes Historical Provider, MD  Cyanocobalamin (B-12 PO) Take 1 tablet by mouth every evening.    Yes Historical Provider, MD  Dextromethorphan-Guaifenesin (MUCINEX DM MAXIMUM STRENGTH) 60-1200 MG TB12 Take 1 tablet by mouth 2 (two) times daily.   Yes Historical Provider, MD  fluticasone (FLONASE) 50 MCG/ACT nasal spray Place 2 sprays into both nostrils daily. 07/01/13  Yes Historical Provider, MD  metolazone (ZAROXOLYN) 2.5 MG tablet Take 2.5 mg TODAY, and 2.5 mg TOMORROW then STOP 12/30/13  Yes Laqueta LindenSuresh A Koneswaran, MD  mirtazapine (REMERON) 15 MG tablet Take 15 mg by mouth at bedtime.  02/03/13  Yes Historical Provider, MD  Multiple Vitamin (MULTIVITAMIN) tablet Take 1 tablet by mouth 2 (two) times daily.   Yes Historical Provider, MD  pantoprazole (PROTONIX) 40 MG tablet Take 40 mg by mouth daily.   Yes Historical Provider, MD  polyethylene glycol powder (GLYCOLAX/MIRALAX) powder Take 17 g by mouth every morning.   Yes Historical Provider, MD  potassium chloride SA (K-DUR,KLOR-CON) 20 MEQ tablet Take 2 tabs (40 meq total) in the am , and 1 tablet (20 meq total) in the afternoon 12/30/13  Yes Dyann KiefMichele M Lenze, PA-C  predniSONE (DELTASONE) 1 MG tablet Take 1-3 mg by mouth See admin instructions. 3mg  x for 1 week 2mg  x for 1 week 1mg  x for 1 week then STOP   Yes Historical Provider, MD  simvastatin (ZOCOR) 40 MG tablet Take 40 mg by mouth daily.  07/29/13  Yes Historical Provider, MD  torsemide (DEMADEX) 20 MG tablet Take 2 tablets (40 mg total) by mouth 2 (two) times daily. 12/17/13  Yes Brittainy M Simmons, PA-C  VENTOLIN HFA 108 (90 BASE) MCG/ACT inhaler Inhale 2 puffs into the lungs every 4 (four) hours as needed. 08/18/13  Yes Historical Provider, MD   BP 81/62  Pulse 85  Temp(Src) 98.1 F (36.7 C) (Oral)   Resp 19  Ht 5\' 7"  (1.702 m)  Wt 170 lb (77.111 kg)  BMI 26.62 kg/m2  SpO2 100% Physical Exam  Constitutional: He is oriented to person, place, and time. He appears well-developed and well-nourished. No distress.  Atrial male. Awake alert. Mild increased work of breathing.  HENT:  Head: Normocephalic.  Dry mucous membranes  Eyes: Conjunctivae are normal. Pupils are equal, round, and reactive to light. No scleral icterus.  Neck: Normal range of motion. Neck supple. No thyromegaly present.  No JVD  Cardiovascular: Normal rate and regular rhythm.  Exam reveals no gallop and no friction rub.   No murmur heard. Sinus rhythm on the monitor.  Pulmonary/Chest: Effort normal and breath sounds normal. No respiratory distress. He has no wheezes. He has no rales.  Wheezing and prolongation in all fields.  Abdominal: Soft. Bowel sounds are normal. He exhibits no  distension. There is no tenderness. There is no rebound.  Musculoskeletal: Normal range of motion.  Neurological: He is alert and oriented to person, place, and time.  Skin: Skin is warm and dry. No rash noted.  Psychiatric: He has a normal mood and affect. His behavior is normal.    ED Course  Procedures (including critical care time) Labs Review Labs Reviewed  CBC WITH DIFFERENTIAL - Abnormal; Notable for the following:    RBC 3.93 (*)    Hemoglobin 12.4 (*)    HCT 36.1 (*)    RDW 16.1 (*)    Eosinophils Relative 8 (*)    All other components within normal limits  BASIC METABOLIC PANEL - Abnormal; Notable for the following:    Potassium 3.4 (*)    Glucose, Bld 119 (*)    BUN 63 (*)    Creatinine, Ser 2.57 (*)    GFR calc non Af Amer 21 (*)    GFR calc Af Amer 24 (*)    Anion gap 17 (*)    All other components within normal limits  PRO B NATRIURETIC PEPTIDE - Abnormal; Notable for the following:    Pro B Natriuretic peptide (BNP) 5263.0 (*)    All other components within normal limits  TROPONIN I    Imaging Review Dg  Chest Port 1 View  12/30/2013   CLINICAL DATA:  80 year old male acute shortness of Breath. Initial encounter. Personal history of emphysema.  EXAM: PORTABLE CHEST - 1 VIEW  COMPARISON:  12/12/2013 and earlier.  FINDINGS: Portable AP upright view at 2002 hrs. Lower lung volumes with crowding of lung markings. Stable cardiac size and mediastinal contours. No pneumothorax. No definite pleural effusion or pulmonary edema. No consolidation.  IMPRESSION: Low lung volumes, otherwise no acute cardiopulmonary abnormality.   Electronically Signed   By: Augusto Gamble M.D.   On: 12/30/2013 20:20     EKG Interpretation None      MDM   Final diagnoses:  SOB (shortness of breath)  COPD exacerbation  Hypotension, unspecified hypotension type    After nebulized albuterol and symptoms improved. He is less short of breath. He remains hypotensive. Has a known EF of 15. He has been on an increased dose of demadex, and Zaroxolyn was added yesterday. I think his hypotension is likely prerenal/hypovolemia. His kidney function is at baseline. Is being given gentle IV fluids, with consideration to his tenuous volume tolerance c EF of 15.  have asked for 250cc over one hour. His pressure is improving at 85.  I did review his most recent hospitalization. Upon discharged and on day prior to discharge systolic pressure was between 604-540.  He is a sinus rhythm, and stable now. He was having considerable ectopy on arrival. Occasional idioventricular couples, and bigeminy. Potassium is 3.4. BNP is 5263. X-ray does not show failure.  I think his dyspnea is 2/2 copd and this is improving.  His Hypotension, likely 2/2 hypovolemia 2/2 multiple diuretic therapy.      Rolland Porter, MD 12/30/13 9811  Rolland Porter, MD 12/30/13 2202

## 2013-12-30 NOTE — Telephone Encounter (Signed)
Give 2.5 mg metolazone today and tomorrow. Will f/u with Dr. Wyline MoodBranch this week.

## 2013-12-30 NOTE — ED Notes (Signed)
Per EMS, patient has had shortness of breath since noon.

## 2013-12-30 NOTE — ED Notes (Signed)
Patient resting at this time. No distress noted at this time.

## 2013-12-30 NOTE — Telephone Encounter (Signed)
This was done on 12/26/13

## 2013-12-30 NOTE — Telephone Encounter (Signed)
Message copied by Nori RiisARLTON, [place] Stanger A on Tue Dec 30, 2013  2:12 PM ------      Message from: Dyann KiefLENZE, MICHELE M      Created: Tue Dec 30, 2013  1:06 PM       Potassium still low. Increase Kdur 40 meq in am 20 meq in pm. Repeat bmet next week ------

## 2013-12-30 NOTE — H&P (Signed)
[facility] History and Physical  Brett LocketLawson Wisehart FUX:[id number] DOB: 1923/09/21 DOA: 12/30/2013  Referring physician: Dr. Fayrene FearingJames PCP: Ignatius SpeckingVYAS,DHRUV B., MD  Specialists: Dr. Wyline MoodBranch - Cardiology  Chief Complaint: SOB  Assessment/Plan Active Problems:   COPD exacerbation Afib Acute on Chronic Kidney Disease Systolic CHF HTN HLD  COPD: recently treated w/ Azithromycin for URI.  Mild case. No O2 requirement adn marked improvement w/ breathing treatment in Ed - Prednisone 50mg  Qday - DUoneb Q4  Afib: New onset. Likely from worsening ventricular function and diffuse hypokinesis.  Also w/ new RBBB though last EKG w/ early signs of change at last Cardiology appt on 12/24/13. Troponin neg. Cha2DS2-VASc score 5.  - Cardiology consult in the am - Pts cardiologist Dr. Wyline MoodBranch - Tele - TSH, free T4 - Lovenox. Consider discussing benefit of long term therapy prior to starting in the am.   Hypotension: likely multifactorial from severe systolic dysfunction, New onset Afib, intravascular depletion from recent increase in home diuresis, and adrenal insufficiency from steroid taper (pt undergoing taper since December per rheumatologist for unknown reason per family). Some response in ED from gentle hydration. 250cc bolus in ED. - continue gentle fluid hydration. IVF NS 4675ml/hr - prednisone increase to 50mg  for COPD exacerbation likely to benefit as well. Consider taper on discharge.  - Hold home Ziac until pressure improves - orthostatics  Acute on Chronic Kidney Disease IV: Cr 2.57. Baseline 2.3. improveing from recent hospitalization where pt underwent heavy diuresis - BMET in am.  - fluids as above  CHF: Echo 12/13/13 EF <15%. Recent admission, 12/10/13 for CHF exacerbation. Pts home torsemide was doubled. BNP 5263 is improved from previous admission. No Signs of overload at time of admission - Decrease torsemide to Qday due to hypotension - continue metolazone in the  am  Depression:  - continue home celexa and remeron  GERD:  - PPI  DVT Prophylaxis: Hep Linden TID  Code Status: Partial Family Communication: Wife and daughter present for admission Disposition Plan: pending impropvement   HPI: Brett Baldwin is a 80 y.o. male [facility] with  SOB. Left [facility] 2 wks ago after admission for CHF exacerbation. Increased his diuretics and blood pressure medeications during the hospitalization. Wt up 4lbs since discharge. Decreased PO during this time. Fell 3 times at home since leaving the hospital. Pt w/o recollection of those events. Feeling progressively more short of breath and dizzy and weak during this time. Associated w/ intermittent chest tightness.   Review of Systems: Per HPI w/ all other systems negative.   Past Medical History  Diagnosis Date  . Hypertension   . History of pulmonary embolism     No pulmonary embolus on the current CT scan as of 03/01/2009  . Carotid artery stenosis      Status post CT of head and neck. Right common carotid artery and right internal carotid artery were occluded not contiguous string sign detected. Prominent irregular calcified plaque noted in the left carotid artery with an estimated 60% diameter stenosis. Also 75% stenosis in the proximal vertebral artery..  . COPD (chronic obstructive pulmonary disease)   . Pulmonary nodules   . Swallowing difficulty   . CAD (coronary artery disease)     [facility].  . Ischemic cardiomyopathy   . Chronic systolic heart failure   . CKD (chronic kidney disease), stage IV   . Weight loss    Past Surgical History  Procedure Laterality Date  .  Cardiac catheterization  2007    revealing patent grafts  . Coronary artery bypass graft  1995  . Prostate surgery      Hx of bypass prostate infections   Social History:  History   Social History Narrative   Married    No Known Allergies  Family History   Problem Relation Age of Onset  . Heart disease Neg Hx   . Ataxia Neg Hx   . Chorea Neg Hx   . Dementia Neg Hx   . Mental retardation Neg Hx   . Migraines Neg Hx   . Multiple sclerosis Neg Hx   . Neurofibromatosis Neg Hx   . Neuropathy Neg Hx   . Parkinsonism Neg Hx   . Seizures Neg Hx   . Stroke Neg Hx      Prior to Admission medications   Medication Sig Start Date End Date Taking? Authorizing Provider  aspirin EC 81 MG EC tablet Take 1 tablet (81 mg total) by mouth daily. 12/17/13  Yes Brittainy Sherlynn Carbon, PA-C  bisoprolol-hydrochlorothiazide (ZIAC) 5-6.25 MG per tablet Take 1 tablet by mouth daily.   Yes Historical Provider, MD  cholecalciferol (VITAMIN D) 1000 UNITS tablet Take 1,000 Units by mouth every evening.    Yes Historical Provider, MD  citalopram (CELEXA) 10 MG tablet Take 10 mg by mouth every evening.    Yes Historical Provider, MD  Cyanocobalamin (B-12 PO) Take 1 tablet by mouth every evening.    Yes Historical Provider, MD  Dextromethorphan-Guaifenesin (MUCINEX DM MAXIMUM STRENGTH) 60-1200 MG TB12 Take 1 tablet by mouth 2 (two) times daily.   Yes Historical Provider, MD  fluticasone (FLONASE) 50 MCG/ACT nasal spray Place 2 sprays into both nostrils daily. 07/01/13  Yes Historical Provider, MD  metolazone (ZAROXOLYN) 2.5 MG tablet Take 2.5 mg TODAY, and 2.5 mg TOMORROW then STOP 12/30/13  Yes Laqueta Linden, MD  mirtazapine (REMERON) 15 MG tablet Take 15 mg by mouth at bedtime.  02/03/13  Yes Historical Provider, MD  Multiple Vitamin (MULTIVITAMIN) tablet Take 1 tablet by mouth 2 (two) times daily.   Yes Historical Provider, MD  pantoprazole (PROTONIX) 40 MG tablet Take 40 mg by mouth daily.   Yes Historical Provider, MD  polyethylene glycol powder (GLYCOLAX/MIRALAX) powder Take 17 g by mouth every morning.   Yes Historical Provider, MD  potassium chloride SA (K-DUR,KLOR-CON) 20 MEQ tablet Take 2 tabs (40 meq total) in the am , and 1 tablet (20 meq total) in the  afternoon 12/30/13  Yes Dyann Kief, PA-C  predniSONE (DELTASONE) 1 MG tablet Take 1-3 mg by mouth See admin instructions. 3mg  x for 1 week 2mg  x for 1 week 1mg  x for 1 week then STOP   Yes Historical Provider, MD  simvastatin (ZOCOR) 40 MG tablet Take 40 mg by mouth daily.  07/29/13  Yes Historical Provider, MD  torsemide (DEMADEX) 20 MG tablet Take 2 tablets (40 mg total) by mouth 2 (two) times daily. 12/17/13  Yes Brittainy M Simmons, PA-C  VENTOLIN HFA 108 (90 BASE) MCG/ACT inhaler Inhale 2 puffs into the lungs every 4 (four) hours as needed. 08/18/13  Yes Historical Provider, MD   Physical Exam: Filed Vitals:   12/30/13 2013 12/30/13 2037 12/30/13 2130 12/30/13 2200  BP:  76/60 81/62 76/58   Pulse:  84 85 86  Temp:      TempSrc:      Resp:  16 19 20   Height:      Weight:  SpO2: 99% 99% 100% 97%     General:   NAD  Eyes: EOMI   ENT: mmm,  Neck: FROM  Cardiovascular:  RRR, no m/r/g  Respiratory: Nml WOB. Mild intermittent wheezes, , no ronchi or crackles  Abdomen: NABS, nonttp  Skin: warm, well perfused, intact  Musculoskeletal: No LE edema, moves all extremities spontaneously   Psychiatric: nml affect  Neurologic: CN2-12 Grossly intact, cerebellar fxn nml  Labs on Admission:  Basic Metabolic Panel:  Recent Labs Lab 12/24/13 1208 12/29/13 1301 12/30/13 1949  NA 137 140 140  K 3.0* 3.2* 3.4*  CL 93* 97 96  CO2 27 27 27   GLUCOSE 139* 149* 119*  BUN 85* 71* 63*  CREATININE 3.31* 2.80* 2.57*  CALCIUM 10.1 8.2* 8.6   Liver Function Tests: No results found for this basename: AST, ALT, ALKPHOS, BILITOT, PROT, ALBUMIN,  in the last 168 hours No results found for this basename: LIPASE, AMYLASE,  in the last 168 hours No results found for this basename: AMMONIA,  in the last 168 hours CBC:  Recent Labs Lab 12/30/13 1949  WBC 8.8  NEUTROABS 5.6  HGB 12.4*  HCT 36.1*  MCV 91.9  PLT 193   Cardiac Enzymes:  Recent Labs Lab 12/30/13 1949    TROPONINI <0.30    BNP (last 3 results)  Recent Labs  12/10/13 2334 12/30/13 1949  PROBNP 6667.0* 5263.0*   CBG: No results found for this basename: GLUCAP,  in the last 168 hours  Radiological Exams on Admission: Dg Chest Port 1 View  12/30/2013   CLINICAL DATA:  80 year old male acute shortness of Breath. Initial encounter. Personal history of emphysema.  EXAM: PORTABLE CHEST - 1 VIEW  COMPARISON:  12/12/2013 and earlier.  FINDINGS: Portable AP upright view at 2002 hrs. Lower lung volumes with crowding of lung markings. Stable cardiac size and mediastinal contours. No pneumothorax. No definite pleural effusion or pulmonary edema. No consolidation.  IMPRESSION: Low lung volumes, otherwise no acute cardiopulmonary abnormality.   Electronically Signed   By: Augusto GambleLee  Hall M.D.   On: 12/30/2013 20:20       Time spent: >70 min in direct pt care and coordination   MERRELL, DAVID J, MD [facility] [email or web address] Password [email or web address] 12/30/2013, 10:35 PM

## 2013-12-30 NOTE — Telephone Encounter (Signed)
Per PT assistant Shelly from home health who is at pt's home-wife reports 4.7 lbs weight gain in patient since last Thursday and has increased SOB,she has not given him any extra diuretics.Visiting nurse is coming out later today and pt has apt with Dr.Branch tomorrow    Will forward to Dr.Koneswaran who is covering for Dr.branch today for dispo

## 2013-12-31 ENCOUNTER — Encounter (HOSPITAL_COMMUNITY): Payer: Self-pay | Admitting: Cardiology

## 2013-12-31 ENCOUNTER — Encounter: Payer: Medicare Other | Admitting: Cardiology

## 2013-12-31 DIAGNOSIS — J441 Chronic obstructive pulmonary disease with (acute) exacerbation: Principal | ICD-10-CM

## 2013-12-31 DIAGNOSIS — R0602 Shortness of breath: Secondary | ICD-10-CM

## 2013-12-31 DIAGNOSIS — I5023 Acute on chronic systolic (congestive) heart failure: Secondary | ICD-10-CM

## 2013-12-31 DIAGNOSIS — E876 Hypokalemia: Secondary | ICD-10-CM | POA: Diagnosis present

## 2013-12-31 DIAGNOSIS — N184 Chronic kidney disease, stage 4 (severe): Secondary | ICD-10-CM

## 2013-12-31 LAB — TROPONIN I: Troponin I: <0.3 ng/mL (ref <0.30)

## 2013-12-31 LAB — BASIC METABOLIC PANEL
ANION GAP: 16 — AB (ref 5–15)
BUN: 60 mg/dL — ABNORMAL HIGH (ref 6–23)
CALCIUM: 8.1 mg/dL — AB (ref 8.4–10.5)
CO2: 25 meq/L (ref 19–32)
Chloride: 99 mEq/L (ref 96–112)
Creatinine, Ser: 2.46 mg/dL — ABNORMAL HIGH (ref 0.50–1.35)
GFR calc Af Amer: 25 mL/min — ABNORMAL LOW (ref >90)
GFR calc non Af Amer: 22 mL/min — ABNORMAL LOW (ref >90)
GLUCOSE: 172 mg/dL — AB (ref 70–99)
POTASSIUM: 3.2 meq/L — AB (ref 3.7–5.3)
SODIUM: 140 meq/L (ref 137–147)

## 2013-12-31 LAB — T4, FREE: FREE T4: 1.46 ng/dL (ref 0.80–1.80)

## 2013-12-31 LAB — TSH: TSH: 2.64 u[IU]/mL (ref 0.350–4.500)

## 2013-12-31 MED ORDER — BISOPROLOL FUMARATE 5 MG PO TABS
5.0000 mg | ORAL_TABLET | Freq: Every day | ORAL | Status: DC
  Administered 2013-12-31 – 2014-01-02 (×3): 5 mg via ORAL
  Filled 2013-12-31 (×6): qty 1

## 2013-12-31 MED ORDER — ACETAMINOPHEN 650 MG RE SUPP: 650.0000 mg | Freq: Four times a day (QID) | RECTAL | Status: DC | PRN

## 2013-12-31 MED ORDER — POTASSIUM CHLORIDE CRYS ER 20 MEQ PO TBCR
40.0000 meq | EXTENDED_RELEASE_TABLET | Freq: Every day | ORAL | Status: DC
  Administered 2013-12-31 – 2014-01-02 (×3): 40 meq via ORAL
  Filled 2013-12-31 (×4): qty 2

## 2013-12-31 MED ORDER — IPRATROPIUM-ALBUTEROL 0.5-2.5 (3) MG/3ML IN SOLN
3.0000 mL | RESPIRATORY_TRACT | Status: DC
  Administered 2013-12-31 (×3): 3 mL via RESPIRATORY_TRACT
  Filled 2013-12-31 (×3): qty 3

## 2013-12-31 MED ORDER — MIRTAZAPINE 30 MG PO TABS
15.0000 mg | ORAL_TABLET | Freq: Every day | ORAL | Status: DC
  Administered 2013-12-31 – 2014-01-01 (×3): 15 mg via ORAL
  Filled 2013-12-31 (×3): qty 1

## 2013-12-31 MED ORDER — CITALOPRAM HYDROBROMIDE 20 MG PO TABS
10.0000 mg | ORAL_TABLET | Freq: Every evening | ORAL | Status: DC
  Administered 2013-12-31 – 2014-01-01 (×2): 10 mg via ORAL
  Filled 2013-12-31 (×2): qty 1

## 2013-12-31 MED ORDER — TORSEMIDE 20 MG PO TABS
40.0000 mg | ORAL_TABLET | Freq: Every day | ORAL | Status: DC
  Administered 2013-12-31 – 2014-01-02 (×3): 40 mg via ORAL
  Filled 2013-12-31 (×3): qty 2

## 2013-12-31 MED ORDER — ONDANSETRON HCL 4 MG/2ML IJ SOLN: 4.0000 mg | Freq: Four times a day (QID) | INTRAMUSCULAR | Status: DC | PRN

## 2013-12-31 MED ORDER — PANTOPRAZOLE SODIUM 40 MG PO TBEC
40.0000 mg | DELAYED_RELEASE_TABLET | Freq: Every day | ORAL | Status: DC
  Administered 2013-12-31 – 2014-01-02 (×3): 40 mg via ORAL
  Filled 2013-12-31 (×3): qty 1

## 2013-12-31 MED ORDER — ONDANSETRON HCL 4 MG PO TABS: 4.0000 mg | ORAL_TABLET | Freq: Four times a day (QID) | ORAL | Status: DC | PRN

## 2013-12-31 MED ORDER — OXYCODONE HCL 5 MG PO TABS
5.0000 mg | ORAL_TABLET | ORAL | Status: DC | PRN
  Administered 2013-12-31: 5 mg via ORAL
  Filled 2013-12-31: qty 1

## 2013-12-31 MED ORDER — PREDNISONE 20 MG PO TABS
50.0000 mg | ORAL_TABLET | Freq: Every day | ORAL | Status: DC
  Administered 2013-12-31 – 2014-01-01 (×2): 50 mg via ORAL
  Filled 2013-12-31: qty 1
  Filled 2013-12-31: qty 2
  Filled 2013-12-31: qty 1
  Filled 2013-12-31: qty 2

## 2013-12-31 MED ORDER — HEPARIN SODIUM (PORCINE) 5000 UNIT/ML IJ SOLN: 5000.0000 [IU] | Freq: Three times a day (TID) | INTRAMUSCULAR | Status: DC

## 2013-12-31 MED ORDER — ACETAMINOPHEN 325 MG PO TABS
650.0000 mg | ORAL_TABLET | Freq: Four times a day (QID) | ORAL | Status: DC | PRN
Start: 2013-12-31 — End: 2014-01-02
  Administered 2014-01-02: 650 mg via ORAL
  Filled 2013-12-31: qty 2

## 2013-12-31 MED ORDER — FLUTICASONE PROPIONATE 50 MCG/ACT NA SUSP
2.0000 | Freq: Every day | NASAL | Status: DC
  Administered 2013-12-31 – 2014-01-02 (×3): 2 via NASAL
  Filled 2013-12-31 (×2): qty 16

## 2013-12-31 MED ORDER — ENOXAPARIN SODIUM 30 MG/0.3ML ~~LOC~~ SOLN
30.0000 mg | SUBCUTANEOUS | Status: DC
  Administered 2013-12-31 – 2014-01-02 (×3): 30 mg via SUBCUTANEOUS
  Filled 2013-12-31 (×3): qty 0.3

## 2013-12-31 MED ORDER — ASPIRIN EC 81 MG PO TBEC
81.0000 mg | DELAYED_RELEASE_TABLET | Freq: Every day | ORAL | Status: DC
  Administered 2013-12-31 – 2014-01-02 (×3): 81 mg via ORAL
  Filled 2013-12-31 (×3): qty 1

## 2013-12-31 MED ORDER — IPRATROPIUM-ALBUTEROL 0.5-2.5 (3) MG/3ML IN SOLN
3.0000 mL | Freq: Four times a day (QID) | RESPIRATORY_TRACT | Status: DC
  Administered 2013-12-31 – 2014-01-02 (×10): 3 mL via RESPIRATORY_TRACT
  Filled 2013-12-31 (×10): qty 3

## 2013-12-31 MED ORDER — SIMVASTATIN 20 MG PO TABS
40.0000 mg | ORAL_TABLET | Freq: Every day | ORAL | Status: DC
  Administered 2013-12-31 – 2014-01-02 (×3): 40 mg via ORAL
  Filled 2013-12-31 (×3): qty 2

## 2013-12-31 MED ORDER — METOLAZONE 5 MG PO TABS: 2.5000 mg | ORAL_TABLET | Freq: Every day | ORAL | Status: DC

## 2013-12-31 MED ORDER — SODIUM CHLORIDE 0.9 % IV SOLN
INTRAVENOUS | Status: DC
  Administered 2013-12-31: 01:00:00 via INTRAVENOUS

## 2013-12-31 NOTE — Care Management Utilization Note (Signed)
UR completed 

## 2013-12-31 NOTE — Consult Note (Signed)
Reason for Consult: CHF/atrial fibrillation Referring Physician: PTH Cardiologist: Dann Galicia is an 80 y.o. male patient of Dr. Harl Bowie who was recently admitted to the hospital 9/30-10/7/15 with acute on chronic combined systolic and diastolic heart failure. He has a history of CAD with CABG in 1995 at [facility]. Recent 2-D echo on 12/12/13 EF was 25-30% with some increased echogenicity in the apex cannot rule out thrombus. He had grade 1 diastolic dysfunction with mild AI. Limited 2-D echo with contrast on 12/13/13 EF was less than 15% with global hypokinesis and no LV thrombus. The patient was given an antibiotic for acute COPD exacerbation and  his torsemide was increased to 40 mg twice a day. The patient also has chronic kidney disease stage IV.  I saw the patient back in the office 12/24/13 and he was doing poorly. He had several days of 3 pound weight gain and increased shortness of breath with walking to the bathroom. His wife was feeding him salty foods. He was taking extra Lasix for the weight gain. His creatinine that day was 3.31 which was worse. He is now back down to 2.57. He does have home health, PT and OT. Patient has also had trouble with hypotension.  Patient comes back in to the hospital with worsening shortness of breath which improved with the breathing treatment in the emergency room. He was also given an IV fluid for hypotension. It was felt he was in atrial fibrillation. CHADSVASC score is 5. BNP 5263, Troponins negative. Patient has had multiple falls since he's been home. He says his legs just give out. He hit his head on the toilet once. He denies dizziness or LOC. He is barely able to walk to the bathroom with a walker, but is in denial of how sick he is. He now is coughing up yellow, blood-tinged sputum. He has chronic dyspnea. No chest pain.  Past Medical History  Diagnosis Date  . Essential hypertension   . History of pulmonary embolism   . Carotid  artery stenosis     Status post CT of head and neck. Right common carotid artery and right internal carotid artery were occluded not contiguous string sign detected. Prominent irregular calcified plaque noted in the left carotid artery with an estimated 60% diameter stenosis. Also 75% stenosis in the proximal vertebral artery..  . COPD (chronic obstructive pulmonary disease)   . Pulmonary nodules   . Swallowing difficulty   . CAD (coronary artery disease), native coronary artery     Coronary artery bypass grafting 1995 at [facility].  . Ischemic cardiomyopathy   . Chronic systolic heart failure   . CKD (chronic kidney disease), stage IV   . Weight loss     Past Surgical History  Procedure Laterality Date  . Cardiac catheterization  2007    Revealing patent grafts  . Coronary artery bypass graft  1995  . Prostate surgery      Hx of bypass prostate infections    Family History  Problem Relation Age of Onset  . Heart disease Neg Hx   . Ataxia Neg Hx   . Chorea Neg Hx   . Dementia Neg Hx   . Mental retardation Neg Hx   . Migraines Neg Hx   . Multiple sclerosis Neg Hx   . Neurofibromatosis Neg Hx   . Neuropathy Neg Hx   . Parkinsonism Neg Hx   . Seizures Neg Hx   . Stroke Neg Hx     Social  History:  reports that he quit smoking about 37 years ago. His smoking use included Cigarettes. He started smoking about 72 years ago. He has a 35 pack-year smoking history. He has never used smokeless tobacco. He reports that he does not drink alcohol or use illicit drugs.  Allergies: No Known Allergies  Medications:  Scheduled Meds: . citalopram  10 mg Oral QPM  . enoxaparin (LOVENOX) injection  30 mg Subcutaneous Q24H  . fluticasone  2 spray Each Nare Daily  . ipratropium-albuterol  3 mL Nebulization QID  . mirtazapine  15 mg Oral QHS  . pantoprazole  40 mg Oral Daily  . potassium chloride SA  40 mEq Oral Daily  . predniSONE  50 mg Oral Q breakfast  . simvastatin  40 mg Oral  Daily  . torsemide  40 mg Oral Daily     PRN Meds:.acetaminophen, acetaminophen, albuterol, ondansetron (ZOFRAN) IV, ondansetron, oxyCODONE   Results for orders placed during the hospital encounter of 12/30/13 (from the past 48 hour(s))  CBC WITH DIFFERENTIAL     Status: Abnormal   Collection Time    12/30/13  7:49 PM      Result Value Ref Range   WBC 8.8  4.0 - 10.5 K/uL   RBC 3.93 (*) 4.22 - 5.81 MIL/uL   Hemoglobin 12.4 (*) 13.0 - 17.0 g/dL   HCT 36.1 (*) 39.0 - 52.0 %   MCV 91.9  78.0 - 100.0 fL   MCH 31.6  26.0 - 34.0 pg   MCHC 34.3  30.0 - 36.0 g/dL   RDW 16.1 (*) 11.5 - 15.5 %   Platelets 193  150 - 400 K/uL   Neutrophils Relative % 62  43 - 77 %   Neutro Abs 5.6  1.7 - 7.7 K/uL   Lymphocytes Relative 20  12 - 46 %   Lymphs Abs 1.8  0.7 - 4.0 K/uL   Monocytes Relative 9  3 - 12 %   Monocytes Absolute 0.8  0.1 - 1.0 K/uL   Eosinophils Relative 8 (*) 0 - 5 %   Eosinophils Absolute 0.7  0.0 - 0.7 K/uL   Basophils Relative 1  0 - 1 %   Basophils Absolute 0.0  0.0 - 0.1 K/uL  BASIC METABOLIC PANEL     Status: Abnormal   Collection Time    12/30/13  7:49 PM      Result Value Ref Range   Sodium 140  137 - 147 mEq/L   Potassium 3.4 (*) 3.7 - 5.3 mEq/L   Chloride 96  96 - 112 mEq/L   CO2 27  19 - 32 mEq/L   Glucose, Bld 119 (*) 70 - 99 mg/dL   BUN 63 (*) 6 - 23 mg/dL   Creatinine, Ser 2.57 (*) 0.50 - 1.35 mg/dL   Calcium 8.6  8.4 - 10.5 mg/dL   GFR calc non Af Amer 21 (*) >90 mL/min   GFR calc Af Amer 24 (*) >90 mL/min   Comment: (NOTE)     The eGFR has been calculated using the CKD EPI equation.     This calculation has not been validated in all clinical situations.     eGFR's persistently <90 mL/min signify possible Chronic Kidney     Disease.   Anion gap 17 (*) 5 - 15  TROPONIN I     Status: None   Collection Time    12/30/13  7:49 PM      Result Value Ref Range  Troponin I <0.30  <0.30 ng/mL   Comment:            Due to the release kinetics of cTnI,     a  negative result within the first hours     of the onset of symptoms does not rule out     myocardial infarction with certainty.     If myocardial infarction is still suspected,     repeat the test at appropriate intervals.  PRO B NATRIURETIC PEPTIDE     Status: Abnormal   Collection Time    12/30/13  7:49 PM      Result Value Ref Range   Pro B Natriuretic peptide (BNP) 5263.0 (*) 0 - 450 pg/mL  TROPONIN I     Status: None   Collection Time    12/31/13 12:46 AM      Result Value Ref Range   Troponin I <0.30  <0.30 ng/mL   Comment:            Due to the release kinetics of cTnI,     a negative result within the first hours     of the onset of symptoms does not rule out     myocardial infarction with certainty.     If myocardial infarction is still suspected,     repeat the test at appropriate intervals.  BASIC METABOLIC PANEL     Status: Abnormal   Collection Time    12/31/13  5:34 AM      Result Value Ref Range   Sodium 140  137 - 147 mEq/L   Potassium 3.2 (*) 3.7 - 5.3 mEq/L   Chloride 99  96 - 112 mEq/L   CO2 25  19 - 32 mEq/L   Glucose, Bld 172 (*) 70 - 99 mg/dL   BUN 60 (*) 6 - 23 mg/dL   Creatinine, Ser 2.46 (*) 0.50 - 1.35 mg/dL   Calcium 8.1 (*) 8.4 - 10.5 mg/dL   GFR calc non Af Amer 22 (*) >90 mL/min   GFR calc Af Amer 25 (*) >90 mL/min   Comment: (NOTE)     The eGFR has been calculated using the CKD EPI equation.     This calculation has not been validated in all clinical situations.     eGFR's persistently <90 mL/min signify possible Chronic Kidney     Disease.   Anion gap 16 (*) 5 - 15  TROPONIN I     Status: None   Collection Time    12/31/13  5:34 AM      Result Value Ref Range   Troponin I <0.30  <0.30 ng/mL   Comment:            Due to the release kinetics of cTnI,     a negative result within the first hours     of the onset of symptoms does not rule out     myocardial infarction with certainty.     If myocardial infarction is still suspected,      repeat the test at appropriate intervals.    Dg Chest Port 1 View  12/30/2013   CLINICAL DATA:  80 year old male acute shortness of Breath. Initial encounter. Personal history of emphysema.  EXAM: PORTABLE CHEST - 1 VIEW  COMPARISON:  12/12/2013 and earlier.  FINDINGS: Portable AP upright view at 2002 hrs. Lower lung volumes with crowding of lung markings. Stable cardiac size and mediastinal contours. No pneumothorax. No definite pleural effusion or pulmonary edema. No  consolidation.  IMPRESSION: Low lung volumes, otherwise no acute cardiopulmonary abnormality.   Electronically Signed   By: Lars Pinks M.D.   On: 12/30/2013 20:20    ROS See HPI Eyes: Negative Ears:Positive for hearing loss,negative tinnitus Cardiovascular: Negative for chest pain, palpitations,irregular heartbeat, positive dyspnea, dyspnea on exertion, near-syncope, orthopnea, paroxysmal nocturnal dyspnea and no syncope,edema, claudication, cyanosis,.  Respiratory:   Positive  for cough, shortness of breath, sleep disturbances due to breathing, sputum production negative for hemoptysis and wheezing.   Endocrine: Negative for cold intolerance and heat intolerance.  Hematologic/Lymphatic: Negative for adenopathy and bleeding problem. Does  bruise/bleed easily.  Musculoskeletal: weak, walks with walker.PT working with at home.   Gastrointestinal:Decreased appetite. Negative for nausea, vomiting, reflux, abdominal pain, diarrhea, constipation.   Genitourinary: Negative for bladder incontinence, dysuria, flank pain, frequency, hematuria, hesitancy, nocturia and urgency.  Neurological: Negative.  Allergic/Immunologic: Negative for environmental allergies.  Blood pressure 114/56, pulse 109, temperature 97.9 F (36.6 C), temperature source Oral, resp. rate 18, height _0  (1.702 m), weight 170 lb 6.4 oz (77.293 kg), SpO2 94.00%. Physical Exam PHYSICAL EXAM: Elderly, short of breath, coughing. Neck:Increased JVD, HJR,noBruit, or  thyroid enlargement Lungs: Decreased breath sounds with bibasilar rales and diffuse rhochi Cardiovascular: RRR, PMI not displaced, heart sounds distant, 2/6 systolic murmur LSB, no bruit, thrill, or heave. Abdomen: BS normal. Soft without organomegaly, masses, lesions or tenderness. Extremities: without cyanosis, clubbing or edema. Good distal pulses bilateral SKin: Warm, no lesions or rashes  Musculoskeletal: No deformities Neuro: no focal signs  EKG: NSR with sinus arrythmia.PVC's RBBB  Assessment/Plan: Acute on chronic systolic CHF NYHA class IV: EF 15% on echo 12/13/13. Patient has CKD, hypotension, is 89 and severe COPD. He is becoming more difficult to manage and family needs to consider hospice. Stop IV fluids, gentle diuresis. Hold metolazone.  CAD S/P CABG: no chest pain  CKD stage IV: watch closely on diuretics.  COPD exacerbation on steroids.  Atrial and Ventricular arrythmias: not a candidate for anticoagulation with multiple falls. No clear cut atrial fibrillation.  Frequent falls: could be hypotension, ventricular arrythmias because of EF 15% or just off balance.  Ermalinda Barrios PA-C 12/31/2013, 9:34 AM    Attending note:   Patient seen and examined. Reviewed records and discussed the case with Ms. Bonnell Public PA-C. Met with patient's wife in the room. Mr. Strine has been declining over the last few weeks, more short of breath, intermittently worsening leg swelling, significant difficulty ambulating or even standing at times without a walker, and recently falling reportedly without loss of consciousness. Wife states that she is not able to adequately take care of him, he does have home health nursing coming in. Patient hypotensive at presentation, chest x-ray showing low lung volumes without obvious infiltrate or effusions. There was question of atrial fibrillation by ECG monitoring, however patient is in sinus rhythm with intermittent, frequent atrial and ventricular ectopy.  Pro BNP 5263. He does not endorse any chest pain, and troponin I levels are normal. Degree of renal insufficiency relatively stable, creatinine 2.4.  Patient with known ischemic cardiomyopathy as well as COPD and CKD stage IV. Has low output, but not markedly fluid overloaded at this time however. Overall poor prognosis despite typical medical therapy interventions. He is not a good candidate for inotropes or other advanced heart failure treatments, particularly in light of his age and frail status. Will place him on Demadex, follow intake and output, do not plan aggressive diuresis however. Replete potassium. Resume beta blocker  in light of ectopy. Start aspirin 81 mg daily. Holding off further afterload agents such as hydralazine/nitrates until we get a better feel for his blood pressure. I would recommend consultation regarding Hospice care.  Satira Sark, M.D., F.A.C.C.

## 2013-12-31 NOTE — Progress Notes (Signed)
Patient ID: Brett Baldwin, male   DOB: 11-28-1923, 80 y.o.   MRN: [id number]    Cancelled APPT [name], M.D.

## 2013-12-31 NOTE — Progress Notes (Signed)
Patient unable to stand at this time for orthostatic vitals.  Will pass on to the day time nurse to try again this morning.

## 2013-12-31 NOTE — Progress Notes (Signed)
Notified by [employer] that pt was having multiple PVCs. Upon assessment pt had no complaints, was alert and oriented, and vital signs were within normal limits. MD notified and no new orders at this time. Will continue to monitor.

## 2013-12-31 NOTE — Progress Notes (Addendum)
[facility] PROGRESS NOTE  Alyson LocketLawson Tidwell XBJ:[id number] DOB: 1924-01-23 DOA: 12/30/2013 PCP: Ignatius SpeckingVYAS,DHRUV B., MD  Assessment/Plan: 1. COPD exacerbation. Appears to be slowly improving. Continue steroids and bronchodilators 2. Acute on chronic systolic CHF. EF is 15%.  Appreciate cardiology input. Medications have been adjusted. Recommendations are for hospice care since he is not a candidate for aggressive treatments. 3. CKD stage 4. Baseline creatinine is 2.3. Continue to follow creatinine especially in light of diuresis 4. Atrial/ventricular ectopy. Restarted on beta blockers 5. Hypotension, likely related to low cardiac output. Continue to follow, otherwise asymptomatic. 6. Hypokalemia. Replace, check magnesium  Code Status: Partial code, patient would want CPR, but does not want intubation, defibrillation or bipap Family Communication: discussed with patient and son at the bedside, and wife over the phone. I have recommended hospice services to the patient and his family. They appear to be agreeable to at least meet with hospice and discuss services that may be available to the patient. We'll try to arrange a meeting with hospice tomorrow Disposition Plan: discharge home once improved   Consultants:  Cardiology  Procedures:    Antibiotics:    HPI/Subjective: Feels better, breathing improving, non productive cough  Objective: Filed Vitals:   12/31/13 1825  BP: 112/50  Pulse: 102  Temp: 98.6 F (37 C)  Resp: 18    Intake/Output Summary (Last 24 hours) at 12/31/13 1902 Last data filed at 12/31/13 1738  Gross per 24 hour  Intake    610 ml  Output    651 ml  Net    -41 ml   Filed Weights   12/30/13 1921 12/30/13 2320 12/31/13 0528  Weight: 77.111 kg (170 lb) 78.699 kg (173 lb 8 oz) 77.293 kg (170 lb 6.4 oz)    Exam:   General:  NAD  Cardiovascular: S1, S2 RRR  Respiratory: mild bilateral wheeze  Abdomen: soft, nt, nd, bs+  Musculoskeletal: no  edema b/l    Data Reviewed: Basic Metabolic Panel:  Recent Labs Lab 12/29/13 1301 12/30/13 1949 12/31/13 0534  NA 140 140 140  K 3.2* 3.4* 3.2*  CL 97 96 99  CO2 27 27 25   GLUCOSE 149* 119* 172*  BUN 71* 63* 60*  CREATININE 2.80* 2.57* 2.46*  CALCIUM 8.2* 8.6 8.1*   Liver Function Tests: No results found for this basename: AST, ALT, ALKPHOS, BILITOT, PROT, ALBUMIN,  in the last 168 hours No results found for this basename: LIPASE, AMYLASE,  in the last 168 hours No results found for this basename: AMMONIA,  in the last 168 hours CBC:  Recent Labs Lab 12/30/13 1949  WBC 8.8  NEUTROABS 5.6  HGB 12.4*  HCT 36.1*  MCV 91.9  PLT 193   Cardiac Enzymes:  Recent Labs Lab 12/30/13 1949 12/31/13 0046 12/31/13 0534  TROPONINI <0.30 <0.30 <0.30   BNP (last 3 results)  Recent Labs  12/10/13 2334 12/30/13 1949  PROBNP 6667.0* 5263.0*   CBG: No results found for this basename: GLUCAP,  in the last 168 hours  No results found for this or any previous visit (from the past 240 hour(s)).   Studies: Dg Chest Port 1 View  12/30/2013   CLINICAL DATA:  80 year old male acute shortness of Breath. Initial encounter. Personal history of emphysema. shortness of Breath. Initial encounter. Personal history of emphysema.  EXAM: PORTABLE CHEST - 1 VIEW  COMPARISON:  12/12/2013 and earlier.  FINDINGS: Portable AP upright view at 2002 hrs. Lower lung volumes with crowding of lung markings. Stable cardiac size and mediastinal contours. No pneumothorax. No definite pleural  effusion or pulmonary edema. No consolidation.  IMPRESSION: Low lung volumes, otherwise no acute cardiopulmonary abnormality.   Electronically Signed   By: Augusto GambleLee  Hall M.D.   On: 12/30/2013 20:20    Scheduled Meds: . aspirin EC  81 mg Oral Daily  . bisoprolol  5 mg Oral Daily  . citalopram  10 mg Oral QPM  . enoxaparin (LOVENOX) injection  30 mg Subcutaneous Q24H  . fluticasone  2 spray Each Nare Daily  . ipratropium-albuterol  3 mL Nebulization QID  . mirtazapine  15 mg Oral QHS  .  pantoprazole  40 mg Oral Daily  . potassium chloride SA  40 mEq Oral Daily  . predniSONE  50 mg Oral Q breakfast  . simvastatin  40 mg Oral Daily  . torsemide  40 mg Oral Daily   Continuous Infusions:   Active Problems:   Essential hypertension, benign   Ischemic cardiomyopathy   Acute on chronic systolic CHF (congestive heart failure), NYHA class 3   CKD (chronic kidney disease), stage IV   COPD exacerbation   Hypokalemia    Time spent: 30mins    Madalynn Pickelsimer  [facility] Pager [phone]. If 7PM-7AM, please contact night-coverage at [email or web address], password [email or web address] 12/31/2013, 7:02 PM  LOS: 1 day

## 2014-01-01 DIAGNOSIS — I255 Ischemic cardiomyopathy: Secondary | ICD-10-CM

## 2014-01-01 DIAGNOSIS — E876 Hypokalemia: Secondary | ICD-10-CM

## 2014-01-01 DIAGNOSIS — I959 Hypotension, unspecified: Secondary | ICD-10-CM

## 2014-01-01 LAB — BASIC METABOLIC PANEL
ANION GAP: 14 (ref 5–15)
BUN: 64 mg/dL — ABNORMAL HIGH (ref 6–23)
CALCIUM: 8.3 mg/dL — AB (ref 8.4–10.5)
CO2: 27 meq/L (ref 19–32)
CREATININE: 2.32 mg/dL — AB (ref 0.50–1.35)
Chloride: 99 mEq/L (ref 96–112)
GFR calc Af Amer: 27 mL/min — ABNORMAL LOW (ref >90)
GFR calc non Af Amer: 23 mL/min — ABNORMAL LOW (ref >90)
GLUCOSE: 167 mg/dL — AB (ref 70–99)
Potassium: 3 mEq/L — ABNORMAL LOW (ref 3.7–5.3)
SODIUM: 140 meq/L (ref 137–147)

## 2014-01-01 LAB — MAGNESIUM: MAGNESIUM: 2 mg/dL (ref 1.5–2.5)

## 2014-01-01 MED ORDER — METHYLPREDNISOLONE SODIUM SUCC 125 MG IJ SOLR
60.0000 mg | Freq: Four times a day (QID) | INTRAMUSCULAR | Status: DC
  Administered 2014-01-01 – 2014-01-02 (×4): 60 mg via INTRAVENOUS
  Filled 2014-01-01 (×4): qty 2

## 2014-01-01 MED ORDER — POTASSIUM CHLORIDE CRYS ER 20 MEQ PO TBCR
40.0000 meq | EXTENDED_RELEASE_TABLET | ORAL | Status: AC
  Administered 2014-01-01 (×2): 40 meq via ORAL
  Filled 2014-01-01: qty 2

## 2014-01-01 MED ORDER — GUAIFENESIN ER 600 MG PO TB12
1200.0000 mg | ORAL_TABLET | Freq: Two times a day (BID) | ORAL | Status: DC
  Administered 2014-01-01 – 2014-01-02 (×2): 1200 mg via ORAL
  Filled 2014-01-01 (×2): qty 2

## 2014-01-01 NOTE — Care Management Note (Signed)
    Page 1 of 1   01/02/2014     5:04:40 PM CARE MANAGEMENT NOTE 01/02/2014  Patient:  Brett Baldwin,Brett Baldwin   Account Number:  [id number]  Date Initiated:  01/01/2014  Documentation initiated by:  Anibal HendersonBOLDEN,Duane Earnshaw  Subjective/Objective Assessment:   Admitted with CHF, COPD, EF below 15%, ARF. pt is from home with spouse. MD spoke with spouse and pt about Hospice and comfort care today.     Action/Plan:   [place]oke [place] and [place] [facility]. Referral called and Pam from Hospice called and spoke with spouse in the room. Mrs Malen GauzeBlankenship is agreeable to hospice, at home and is prepared to take the   Anticipated DC Date:  01/02/2014   Anticipated DC Plan:  HOME W HOSPICE CARE      DC Planning Services  CM consult      PAC Choice  HOSPICE   Choice offered to / List presented to:  C-3 Spouse           HH agency  [facility]   Status of service:  Completed, signed off Medicare Important Message given?  YES (If response is "NO", the following Medicare IM given date fields will be blank) Date Medicare IM given:  01/02/2014 Medicare IM given by:  Anibal HendersonBOLDEN,Tiras Bianchini Date Additional Medicare IM given:   Additional Medicare IM given by:    Discharge Disposition:  HOME W HOSPICE CARE  Per UR Regulation:  Reviewed for med. necessity/level of care/duration of stay  If discussed at Long Length of Stay Meetings, dates discussed:    Comments:  01/02/14 1700 Anibal HendersonGeneva Alexsus Papadopoulos RN/CM Pt D/C home with Hospice services Spouse very pleased with care 01/01/14 1500 Anibal HendersonGeneva Jerrine Urschel RN/CM Pt home when stable, probable tomorrow

## 2014-01-01 NOTE — Progress Notes (Signed)
[facility] PROGRESS NOTE  Alyson LocketLawson Frankie AVW:[id number] DOB: [birth year] DOA: 12/30/2013 PCP: Ignatius SpeckingVYAS,DHRUV B., MD  Assessment/Plan: 1. COPD exacerbation. Wheezing appears to be slightly more pronounced today. We will change prednisone to Solu-Medrol. Continue bronchodilators and pulmonary hygiene. 2. Acute on chronic systolic CHF. EF is 15%.  Appreciate cardiology input. Medications have been adjusted. Recommendations are for hospice care since he is not a candidate for aggressive treatments. The patient and his wife appear to be agreeable for hospice services. 3. CKD stage 4. Baseline creatinine is 2.3. Continue to follow creatinine especially in light of diuresis 4. Atrial/ventricular ectopy. Restarted on beta blockers 5. Hypotension, likely related to low cardiac output. Continue to follow, otherwise asymptomatic. 6. Hypokalemia. Replace, magnesium level normal  Code Status: Partial code, patient would want CPR, but does not want intubation, defibrillation or bipap Family Communication: Discussed with patient and his wife at the bedside. They appear to be agreeable for hospice services. They wish to discuss this further with the rest of the family. Disposition Plan: discharge home once improved   Consultants:  Cardiology  Procedures:    Antibiotics:    HPI/Subjective: Feels better, breathing improving, productive cough  Objective: Filed Vitals:   01/01/14 1507  BP: 97/46  Pulse: 91  Temp: 97.5 F (36.4 C)  Resp: 18    Intake/Output Summary (Last 24 hours) at 01/01/14 1844 Last data filed at 01/01/14 1730  Gross per 24 hour  Intake    360 ml  Output    800 ml  Net   -440 ml   Filed Weights   12/30/13 1921 12/30/13 2320 12/31/13 0528  Weight: 77.111 kg (170 lb) 78.699 kg (173 lb 8 oz) 77.293 kg (170 lb 6.4 oz)    Exam:   General:  NAD  Cardiovascular: S1, S2 RRR  Respiratory:  bilateral wheeze  Abdomen: soft, nt, nd, bs+  Musculoskeletal: no  edema b/l    Data Reviewed: Basic Metabolic Panel:  Recent Labs Lab 12/29/13 1301 12/30/13 1949 12/31/13 0534 01/01/14 0503  NA 140 140 140 140  K 3.2* 3.4* 3.2* 3.0*  CL 97 96 99 99  CO2 27 27 25 27   GLUCOSE 149* 119* 172* 167*  BUN 71* 63* 60* 64*  CREATININE 2.80* 2.57* 2.46* 2.32*  CALCIUM 8.2* 8.6 8.1* 8.3*  MG  --   --   --  2.0   Liver Function Tests: No results found for this basename: AST, ALT, ALKPHOS, BILITOT, PROT, ALBUMIN,  in the last 168 hours No results found for this basename: LIPASE, AMYLASE,  in the last 168 hours No results found for this basename: AMMONIA,  in the last 168 hours CBC:  Recent Labs Lab 12/30/13 1949  WBC 8.8  NEUTROABS 5.6  HGB 12.4*  HCT 36.1*  MCV 91.9  PLT 193   Cardiac Enzymes:  Recent Labs Lab 12/30/13 1949 12/31/13 0046 12/31/13 0534  TROPONINI <0.30 <0.30 <0.30   BNP (last 3 results)  Recent Labs  12/10/13 2334 12/30/13 1949  PROBNP 6667.0* 5263.0*   CBG: No results found for this basename: GLUCAP,  in the last 168 hours  No results found for this or any previous visit (from the past 240 hour(s)).   Studies: Dg Chest Port 1 View  12/30/2013   CLINICAL DATA:  80 year old male acute shortness of Breath. Initial encounter. Personal history of emphysema.  EXAM: PORTABLE CHEST - 1 VIEW  COMPARISON:  12/12/2013 and earlier.  FINDINGS: Portable AP upright view at 2002 hrs. Lower  lung volumes with crowding of lung markings. Stable cardiac size and mediastinal contours. No pneumothorax. No definite pleural effusion or pulmonary edema. No consolidation.  IMPRESSION: Low lung volumes, otherwise no acute cardiopulmonary abnormality.   Electronically Signed   By: Augusto GambleLee  Hall M.D.   On: 12/30/2013 20:20    Scheduled Meds: . aspirin EC  81 mg Oral Daily  . bisoprolol  5 mg Oral Daily  . citalopram  10 mg Oral QPM  . enoxaparin (LOVENOX) injection  30 mg Subcutaneous Q24H  . fluticasone  2 spray Each Nare Daily  .  ipratropium-albuterol  3 mL Nebulization QID  . methylPREDNISolone (SOLU-MEDROL) injection  60 mg Intravenous Q6H  . mirtazapine  15 mg Oral QHS  . pantoprazole  40 mg Oral Daily  . potassium chloride SA  40 mEq Oral Daily  . simvastatin  40 mg Oral Daily  . torsemide  40 mg Oral Daily   Continuous Infusions:   Active Problems:   Essential hypertension, benign   Ischemic cardiomyopathy   Acute on chronic systolic CHF (congestive heart failure), NYHA class 3   CKD (chronic kidney disease), stage IV   COPD exacerbation   Hypokalemia    Time spent: 30mins    Annalicia Renfrew  [facility] Pager [phone]. If 7PM-7AM, please contact night-coverage at [email or web address], password [email or web address] 01/01/2014, 6:44 PM  LOS: 2 days

## 2014-01-01 NOTE — Progress Notes (Signed)
Primary cardiologist: Dr. Dina RichJonathan Branch Consulting cardiologist: Dr. Jonelle SidleSamuel G. Shamyra Farias  Subjective:    Resting after breathing treatment. Wife states that he had a reasonable night. Not eating very well.  Objective:   Temp:  [97.6 F (36.4 C)-98.6 F (37 C)] 97.6 F (36.4 C) (10/22 0526) Pulse Rate:  [94-114] 94 (10/22 0526) Resp:  [14-18] 14 (10/22 0526) BP: (101-144)/(48-95) 103/48 mmHg (10/22 0526) SpO2:  [94 %-98 %] 95 % (10/22 0817) Last BM Date: 12/31/13  Filed Weights   12/30/13 1921 12/30/13 2320 12/31/13 0528  Weight: 170 lb (77.111 kg) 173 lb 8 oz (78.699 kg) 170 lb 6.4 oz (77.293 kg)    Intake/Output Summary (Last 24 hours) at 01/01/14 0818 Last data filed at 01/01/14 0300  Gross per 24 hour  Intake    360 ml  Output    951 ml  Net   -591 ml    Telemetry: Sinus rhythm with PVCs and PACs.  Exam:  General: Elderly, chronically ill-appearing.  Lungs: Coarse breath sounds, no wheezing.  Cardiac: RRR with ectopy, soft S3.  Extremities: Trace edema.   Lab Results:  Basic Metabolic Panel:  Recent Labs Lab 12/30/13 1949 12/31/13 0534 01/01/14 0503  NA 140 140 140  K 3.4* 3.2* 3.0*  CL 96 99 99  CO2 27 25 27   GLUCOSE 119* 172* 167*  BUN 63* 60* 64*  CREATININE 2.57* 2.46* 2.32*  CALCIUM 8.6 8.1* 8.3*  MG  --   --  2.0    CBC:  Recent Labs Lab 12/30/13 1949  WBC 8.8  HGB 12.4*  HCT 36.1*  MCV 91.9  PLT 193    Cardiac Enzymes:  Recent Labs Lab 12/30/13 1949 12/31/13 0046 12/31/13 0534  TROPONINI <0.30 <0.30 <0.30    BNP:  Recent Labs  12/10/13 2334 12/30/13 1949  PROBNP 6667.0* 5263.0*    Imaging:  EXAM: PORTABLE CHEST - 1 VIEW  COMPARISON: 12/12/2013 and earlier.  FINDINGS: Portable AP upright view at 2002 hrs. Lower lung volumes with crowding of lung markings. Stable cardiac size and mediastinal contours. No pneumothorax. No definite pleural effusion or pulmonary edema. No  consolidation.  IMPRESSION: Low lung volumes, otherwise no acute cardiopulmonary abnormality.   Medications:   Scheduled Medications: . aspirin EC  81 mg Oral Daily  . bisoprolol  5 mg Oral Daily  . citalopram  10 mg Oral QPM  . enoxaparin (LOVENOX) injection  30 mg Subcutaneous Q24H  . fluticasone  2 spray Each Nare Daily  . ipratropium-albuterol  3 mL Nebulization QID  . mirtazapine  15 mg Oral QHS  . pantoprazole  40 mg Oral Daily  . potassium chloride SA  40 mEq Oral Daily  . potassium chloride  40 mEq Oral Q3H  . predniSONE  50 mg Oral Q breakfast  . simvastatin  40 mg Oral Daily  . torsemide  40 mg Oral Daily      PRN Medications:  acetaminophen, acetaminophen, albuterol, ondansetron (ZOFRAN) IV, ondansetron, oxyCODONE   Assessment:   1. Acute on chronic systolic heart failure, low output without substantial volume overload. Medicines have been adjusted, mild diuresis with goal to not over diurese. Renal function improving somewhat.  2. Ischemic cardiomyopathy, LVEF recently around 15%. Limited therapeutic options, overall poor prognosis.  3. Frequent falls at home per wife.  4. COPD exacerbation.  5. Acute on chronic renal insufficiency, CKD stage IV.  6. CAD status post CABG.   Plan/Discussion:    No change to current  cardiac regimen. We are continuing Demadex, bisoprolol, aspirin, potassium supplements, and simvastatin. No ACE inhibitor or ARB with progressive renal dysfunction, holding off hydralazine/nitrates with relatively low blood pressure. Not a good candidate for inotropes or other advanced CHF modalities with overall poor prognosis. Hospice consultation has been recommended.   Jonelle SidleSamuel G. Ebonie Westerlund, M.D., F.A.C.C.

## 2014-01-02 DIAGNOSIS — I5041 Acute combined systolic (congestive) and diastolic (congestive) heart failure: Secondary | ICD-10-CM

## 2014-01-02 MED ORDER — TORSEMIDE 20 MG PO TABS: 40.0000 mg | ORAL_TABLET | Freq: Every day | ORAL | Status: AC

## 2014-01-02 MED ORDER — BISOPROLOL FUMARATE 5 MG PO TABS: 5.0000 mg | ORAL_TABLET | Freq: Every day | ORAL | Status: AC

## 2014-01-02 MED ORDER — ALBUTEROL SULFATE (2.5 MG/3ML) 0.083% IN NEBU: 2.5000 mg | INHALATION_SOLUTION | Freq: Four times a day (QID) | RESPIRATORY_TRACT | Status: AC | PRN

## 2014-01-02 MED ORDER — MORPHINE SULFATE (CONCENTRATE) 10 MG /0.5 ML PO SOLN: 10.0000 mg | ORAL | Status: AC | PRN

## 2014-01-02 MED ORDER — PREDNISONE 10 MG PO TABS: ORAL_TABLET | ORAL | Status: AC

## 2014-01-02 MED ORDER — POTASSIUM CHLORIDE CRYS ER 20 MEQ PO TBCR
EXTENDED_RELEASE_TABLET | ORAL | Status: AC
Start: 2014-01-02 — End: ?

## 2014-01-02 NOTE — Discharge Summary (Signed)
Physician Discharge Summary  Brett Baldwin QQV:[id number] DOB: 1924/02/11 DOA: 12/30/2013  PCP: Ignatius Specking., MD  Admit date: 12/30/2013 Discharge date: 01/02/2014  Time spent: 40 minutes  Recommendations for Outpatient Follow-up:  1. Patient will be discharged home with hospice services 2. Follow up with cardiology as an outpatient  Discharge Diagnoses:  Active Problems:   Essential hypertension, benign   Ischemic cardiomyopathy   Acute on chronic systolic CHF (congestive heart failure), NYHA class 3   CKD (chronic kidney disease), stage IV   COPD exacerbation   Hypokalemia DNR  Discharge Condition: stable  Diet recommendation: heart healthy  Filed Weights   12/30/13 1921 12/30/13 2320 12/31/13 0528  Weight: 77.111 kg (170 lb) 78.699 kg (173 lb 8 oz) 77.293 kg (170 lb 6.4 oz)    History of present illness:  This patient was admitted to the hospital with progressive shortness of breath. He was recently discharged from [facility] 2 weeks prior to admission after being treated for congestive heart failure exacerbation. He was increasingly weak at home had fallen multiple times. He was noted to be significantly wheezing was admitted for further treatment of congestive heart failure and COPD exacerbation.  Hospital Course:  Patient was started on steroids, bronchodilators. His wheezing gradually resolved and he feels significantly. He is continuing to expectorate phlegm with cough. Has continued on flutter valve and pulmonary toilet. He was also followed by cardiology during his hospital stay. Last ejection fraction was noted to be <15%. Diuretics were adjusted and he appears to be near euvolemic. He is recommended to continue on daily Demadex followup with cardiology as an outpatient. Due to his severe systolic dysfunction and chronic kidney disease, as well as his advanced age, he was not felt to be candidate for further aggressive treatment such as inotropic support.  Recommendations were for patient to be followed by hospice services. This was discussed with the patient and his wife who appear to be agreeable to follow with hospice services at home. CODE STATUS was changed DO NOT RESUSCITATE. He appears to be euvolemic at this time and cardiology will follow him up as an outpatient. His stage IV chronic kidney disease appears to be at baseline and tolerating current diuresis. He appears to be near euvolemic. COPD exacerbation has significantly improved and the patient is saturating about 90% on room air without any significant wheezing. His functional capacity is very poor at baseline. He will be prescribed morphine for pain management as well as respiratory distress. Patient is otherwise stable for discharge home today.  Procedures:    Consultations:  cardiology  Discharge Exam: Filed Vitals:   01/02/14 1404  BP: 130/60  Pulse: 100  Temp: 97.8 F (36.6 C)  Resp: 18    General: NAD Cardiovascular: S1, S2 RRR Respiratory: CTA B  Discharge Instructions You were cared for by a hospitalist during your hospital stay. If you have any questions about your discharge medications or the care you received while you were in the hospital after you are discharged, you can call the unit and asked to speak with the hospitalist on call if the hospitalist that took care of you is not available. Once you are discharged, your primary care physician will handle any further medical issues. Please note that NO REFILLS for any discharge medications will be authorized once you are discharged, as it is imperative that you return to your primary care physician (or establish a relationship with a primary care physician if you do not have one) for your  aftercare needs so that they can reassess your need for medications and monitor your lab values.  Discharge Instructions   (HEART FAILURE PATIENTS) Call MD:  Anytime you have any of the following symptoms: 1) 3 pound weight gain in  24 hours or 5 pounds in 1 week 2) shortness of breath, with or without a dry hacking cough 3) swelling in the hands, feet or stomach 4) if you have to sleep on extra pillows at night in order to breathe.    Complete by:  As directed      Call MD for:  difficulty breathing, headache or visual disturbances    Complete by:  As directed      DME Nebulizer machine    Complete by:  As directed      Diet - low sodium heart healthy    Complete by:  As directed      Increase activity slowly    Complete by:  As directed           Current Discharge Medication List    START taking these medications   Details  albuterol (PROVENTIL) (2.5 MG/3ML) 0.083% nebulizer solution Take 3 mLs (2.5 mg total) by nebulization every 6 (six) hours as needed for wheezing or shortness of breath. Qty: 75 mL, Refills: 12    bisoprolol (ZEBETA) 5 MG tablet Take 1 tablet (5 mg total) by mouth daily. Qty: 30 tablet, Refills: 1    Morphine Sulfate (MORPHINE CONCENTRATE) 10 mg / 0.5 ml concentrated solution Place 0.5 mLs (10 mg total) under the tongue every 3 (three) hours as needed for severe pain or shortness of breath. Qty: 120 mL, Refills: 0      CONTINUE these medications which have CHANGED   Details  potassium chloride SA (K-DUR,KLOR-CON) 20 MEQ tablet Take 2 tabs (40 meq total) in the am Qty: 90 tablet, Refills: 3    predniSONE (DELTASONE) 10 MG tablet Take 40mg  po daily for 3 days then 30mg  po daily for 3 days then 20mg  po daily for 3 days then 10mg  po daily for 3 days then stop Qty: 30 tablet, Refills: 0    torsemide (DEMADEX) 20 MG tablet Take 2 tablets (40 mg total) by mouth daily. Qty: 60 tablet, Refills: 5      CONTINUE these medications which have NOT CHANGED   Details  aspirin EC 81 MG EC tablet Take 1 tablet (81 mg total) by mouth daily.    cholecalciferol (VITAMIN D) 1000 UNITS tablet Take 1,000 Units by mouth every evening.     citalopram (CELEXA) 10 MG tablet Take 10 mg by mouth every  evening.     Cyanocobalamin (B-12 PO) Take 1 tablet by mouth every evening.     Dextromethorphan-Guaifenesin (MUCINEX DM MAXIMUM STRENGTH) 60-1200 MG TB12 Take 1 tablet by mouth 2 (two) times daily.    fluticasone (FLONASE) 50 MCG/ACT nasal spray Place 2 sprays into both nostrils daily.    mirtazapine (REMERON) 15 MG tablet Take 15 mg by mouth at bedtime.     Multiple Vitamin (MULTIVITAMIN) tablet Take 1 tablet by mouth 2 (two) times daily.    pantoprazole (PROTONIX) 40 MG tablet Take 40 mg by mouth daily.    polyethylene glycol powder (GLYCOLAX/MIRALAX) powder Take 17 g by mouth every morning.    simvastatin (ZOCOR) 40 MG tablet Take 40 mg by mouth daily.     VENTOLIN HFA 108 (90 BASE) MCG/ACT inhaler Inhale 2 puffs into the lungs every 4 (four) hours as needed.  STOP taking these medications     bisoprolol-hydrochlorothiazide (ZIAC) 5-6.25 MG per tablet      metolazone (ZAROXOLYN) 2.5 MG tablet        No Known Allergies Follow-up Information   Follow up with Joni Reining, NP On 01/07/2014. (Your appointment is at 1:30pm )    Specialty:  Nurse Practitioner   Contact information:   [address] [place] [address] [phone]       Follow up with [facility]. (They will call you)    Contact information:   [address] [place] [address] [phone]        The results of significant diagnostics from this hospitalization (including imaging, microbiology, ancillary and laboratory) are listed below for reference.    Significant Diagnostic Studies: Dg Chest 2 View  12/12/2013   CLINICAL DATA:  CHF. Patient admitted to the hospital 4 days ago for shortness of breath. History of smoking, COPD, hypertension, ischemic cardiomyopathy, pulmonary embolism. Subsequent encounter.  EXAM: CHEST  2 VIEW  COMPARISON:  12/10/2013; 08/15/2013  FINDINGS: Grossly unchanged enlarged cardiac silhouette and mediastinal contours with atherosclerotic plaque within  the thoracic aorta. Post median sternotomy. Overall improved aeration of the lungs. Unchanged mild eventration of the right hemidiaphragm. No pleural effusion or pneumothorax. No evidence of edema. Grossly unchanged focal kyphosis involving the lower thoracic spine. Regional soft tissues appear normal.  IMPRESSION: Improved aeration along suggests resolved edema and atelectasis.   Electronically Signed   By: Simonne Come M.D.   On: 12/12/2013 18:02   US Renal  12/11/2013   CLINICAL DATA:  Decreased urinary output. History of hypertension. Chronic kidney disease.  EXAM: RENAL/URINARY TRACT ULTRASOUND COMPLETE  COMPARISON:  CT 12/08/2010  FINDINGS: Right Kidney:  Length: 8.3 cm. The renal cortex is thin and increased in echogenicity. No hydronephrosis. There are multiple hypoechoic lesions are demonstrated throughout renal cortex, the largest of which measures 1.5 cm within the interpolar region of the right kidney. These are not able to be definitively characterized on current examination.  Left Kidney:  Length: 13.4 cm. The renal cortex is thin and increased in echogenicity. There is a dominant cyst within the inferior pole of the left kidney measuring up to 9.4 cm. Multiple additional hypoechoic lesions within the left kidney are not able to be definitively characterized on current examination.  Bladder:  Appears normal for degree of bladder distention.  IMPRESSION: No hydronephrosis.  Bilateral renal cortical thinning and increased echogenicity compatible with chronic renal disease.  Multiple bilateral hypoechoic renal lesions, not definitely characterized on current examination. Patient has known bilateral renal cysts.   Electronically Signed   By: Annia Belt M.D.   On: 12/11/2013 16:48   Nm Pulmonary Perf And Vent  12/11/2013   ADDENDUM REPORT: 12/11/2013 19:47  ADDENDUM: The patient's physician states that she has low clinical suspicion of pulmonary embolus. This information, as well as reconsideration of  the significance of the defect seen involving the superior segment of left lower lobe, suggests low probability of pulmonary embolus.   Electronically Signed   By: Roque Lias M.D.   On: 12/11/2013 19:47   12/11/2013   CLINICAL DATA:  Shortness of breath.  Elevated D-dimer level.  EXAM: NUCLEAR MEDICINE VENTILATION - PERFUSION LUNG SCAN  TECHNIQUE: Ventilation images were obtained in multiple projections using inhaled aerosol technetium 99 M DTPA. Perfusion images were obtained in multiple projections after intravenous injection of Tc-12m MAA.  RADIOPHARMACEUTICALS:  40.0 mCi Tc-6m DTPA aerosol and  6.0 mCi Tc-3342m MAA  COMPARISON:  Chest radiograph of December 10, 2013.  FINDINGS: Ventilation: Multiple small subsegmental defects are noted throughout the right lung. Large defect is seen involving most of the left upper lobe.  Perfusion: Moderate to large defect is seen involving the posterior portion of the left upper lobe correspond and the defects seen on ventilation study. Another moderate defect is seen involving the apex of the left upper lobe.  IMPRESSION: To match moderate to large defects are seen involving the left upper lobe consistent for intermediate probability of pulmonary embolus.  Electronically Signed: By: Roque LiasJames  Green M.D. On: 12/11/2013 15:58   Dg Chest Port 1 View  12/30/2013   CLINICAL DATA:  80 year old male acute shortness of Breath. Initial encounter. Personal history of emphysema.  EXAM: PORTABLE CHEST - 1 VIEW  COMPARISON:  12/12/2013 and earlier.  FINDINGS: Portable AP upright view at 2002 hrs. Lower lung volumes with crowding of lung markings. Stable cardiac size and mediastinal contours. No pneumothorax. No definite pleural effusion or pulmonary edema. No consolidation.  IMPRESSION: Low lung volumes, otherwise no acute cardiopulmonary abnormality.   Electronically Signed   By: Augusto GambleLee  Hall M.D.   On: 12/30/2013 20:20   Dg Chest Port 1 View  12/11/2013   CLINICAL DATA:  Shortness of  breath, left chest pain  EXAM: PORTABLE CHEST - 1 VIEW  COMPARISON:  [facility] chest radiograph dated 12/10/2013 at 1641 hrs.  FINDINGS: Cardiomegaly with pulmonary vascular congestion and possible mild interstitial edema. No pleural effusion or pneumothorax.  Postsurgical changes related to prior CABG.  IMPRESSION: Cardiomegaly with pulmonary vascular congestion and possible mild interstitial edema.   Electronically Signed   By: Charline BillsSriyesh  Krishnan M.D.   On: 12/11/2013 00:43    Microbiology: No results found for this or any previous visit (from the past 240 hour(s)).   Labs: Basic Metabolic Panel:  Recent Labs Lab 12/29/13 1301 12/30/13 1949 12/31/13 0534 01/01/14 0503  NA 140 140 140 140  K 3.2* 3.4* 3.2* 3.0*  CL 97 96 99 99  CO2 27 27 25 27   GLUCOSE 149* 119* 172* 167*  BUN 71* 63* 60* 64*  CREATININE 2.80* 2.57* 2.46* 2.32*  CALCIUM 8.2* 8.6 8.1* 8.3*  MG  --   --   --  2.0   Liver Function Tests: No results found for this basename: AST, ALT, ALKPHOS, BILITOT, PROT, ALBUMIN,  in the last 168 hours No results found for this basename: LIPASE, AMYLASE,  in the last 168 hours No results found for this basename: AMMONIA,  in the last 168 hours CBC:  Recent Labs Lab 12/30/13 1949  WBC 8.8  NEUTROABS 5.6  HGB 12.4*  HCT 36.1*  MCV 91.9  PLT 193   Cardiac Enzymes:  Recent Labs Lab 12/30/13 1949 12/31/13 0046 12/31/13 0534  TROPONINI <0.30 <0.30 <0.30   BNP: BNP (last 3 results)  Recent Labs  12/10/13 2334 12/30/13 1949  PROBNP 6667.0* 5263.0*   CBG: No results found for this basename: GLUCAP,  in the last 168 hours     Signed:  MEMON,JEHANZEB  [facility] 01/02/2014, 3:33 PM

## 2014-01-02 NOTE — Progress Notes (Signed)
Discharge instructions given and reviewed with pt and wife. No concerns or questions. Prescriptions given. Pt knows if he starts to feel bad again to call the MD or come to the ER. Will be leaving soon

## 2014-01-02 NOTE — Progress Notes (Signed)
Patient ID: Alyson LocketLawson Mago, male   DOB: 23-Jan-1924, 80 y.o.   MRN: [id number]     Subjective:   SOB has improved   Objective:   Temp:  [97.5 F (36.4 C)-98 F (36.7 C)] 97.6 F (36.4 C) (10/23 0435) Pulse Rate:  [58-98] 98 (10/23 0435) Resp:  [16-18] 16 (10/23 0435) BP: (97-133)/(46-72) 133/72 mmHg (10/23 0435) SpO2:  [96 %-100 %] 96 % (10/23 0746) Last BM Date: 01/01/14  Filed Weights   12/30/13 1921 12/30/13 2320 12/31/13 0528  Weight: 170 lb (77.111 kg) 173 lb 8 oz (78.699 kg) 170 lb 6.4 oz (77.293 kg)    Intake/Output Summary (Last 24 hours) at 01/02/14 0947 Last data filed at 01/02/14 0900  Gross per 24 hour  Intake    480 ml  Output    500 ml  Net    -20 ml    Telemetry: SR, PVCs  Exam:  General: NAD  Resp: CTAB  Cardiac: RRR, no m/r/g, no JVD  GI: abdomen soft, NT, ND  MSK: no LE edema  Neuro: no focal deficits  Psych: appropriate affect  Lab Results:  Basic Metabolic Panel:  Recent Labs Lab 12/30/13 1949 12/31/13 0534 01/01/14 0503  NA 140 140 140  K 3.4* 3.2* 3.0*  CL 96 99 99  CO2 27 25 27   GLUCOSE 119* 172* 167*  BUN 63* 60* 64*  CREATININE 2.57* 2.46* 2.32*  CALCIUM 8.6 8.1* 8.3*  MG  --   --  2.0    Liver Function Tests: No results found for this basename: AST, ALT, ALKPHOS, BILITOT, PROT, ALBUMIN,  in the last 168 hours  CBC:  Recent Labs Lab 12/30/13 1949  WBC 8.8  HGB 12.4*  HCT 36.1*  MCV 91.9  PLT 193    Cardiac Enzymes:  Recent Labs Lab 12/30/13 1949 12/31/13 0046 12/31/13 0534  TROPONINI <0.30 <0.30 <0.30    BNP:  Recent Labs  12/10/13 2334 12/30/13 1949  PROBNP 6667.0* 5263.0*    Coagulation: No results found for this basename: INR,  in the last 168 hours  ECG:   Medications:   Scheduled Medications: . aspirin EC  81 mg Oral Daily  . bisoprolol  5 mg Oral Daily  . citalopram  10 mg Oral QPM  . enoxaparin (LOVENOX) injection  30 mg Subcutaneous Q24H  . fluticasone  2 spray Each  Nare Daily  . guaiFENesin  1,200 mg Oral BID  . ipratropium-albuterol  3 mL Nebulization QID  . methylPREDNISolone (SOLU-MEDROL) injection  60 mg Intravenous Q6H  . mirtazapine  15 mg Oral QHS  . pantoprazole  40 mg Oral Daily  . potassium chloride SA  40 mEq Oral Daily  . simvastatin  40 mg Oral Daily  . torsemide  40 mg Oral Daily     Infusions:     PRN Medications:  acetaminophen, acetaminophen, albuterol, ondansetron (ZOFRAN) IV, ondansetron, oxyCODONE     Assessment/Plan    1. Acute on chronic systolic HF - ICM, LVEF >15% by echo with contrast 12/2013. CXR on admit without definite edema, BNP 5263 on admit, decreased from prior lab 11/2013 - medical therapy has been limited due to CKD and orthostatic symptoms. Not on ACE/ARB/aldactone due to renal function, hydral previously caused orthostatic symptoms - patient negative 140 mL yesterday, negative 240mL since admission. Cr is trending down. - severe advanced heart failure, poor candidate for advanced therapies. Continue current medical therapy, agree with consideration for hospice care.  2. COPD - management per primary  team   From cardiac standpoint ok for discharge today or over the weekend. Will have him follow up with our NP next week.    Dina RichJonathan Tyah Acord, M.D.

## 2014-01-05 NOTE — Progress Notes (Signed)
UR chart review completed.  

## 2014-01-06 NOTE — Progress Notes (Signed)
HPI: [name] is a 80 y/o patient of Dr. Wyline MoodBranch we are following for ongoing assessment and management of hypertension, ICM with EF < 15%, recent hospitalization for A/C systolic CHF.   He was treated with IV diuretic until euvolemic state. Demedex dose was adjusted. He was also referred to Hospice and made DNR.  Wt on discharge 170 lbs.   He comes today without complaints. He and his family are participating in the Hospice home care and are taking advantage of the services. He is wearing O2 at all times now. He is avoiding salt, Has no complaints of breathing issues.   No Known Allergies  Current Outpatient Prescriptions  Medication Sig Dispense Refill  . albuterol (PROVENTIL) (2.5 MG/3ML) 0.083% nebulizer solution Take 3 mLs (2.5 mg total) by nebulization every 6 (six) hours as needed for wheezing or shortness of breath.  75 mL  12  . aspirin EC 81 MG EC tablet Take 1 tablet (81 mg total) by mouth daily.      . bisoprolol (ZEBETA) 5 MG tablet Take 1 tablet (5 mg total) by mouth daily.  30 tablet  1  . cholecalciferol (VITAMIN D) 1000 UNITS tablet Take 1,000 Units by mouth every evening.       . citalopram (CELEXA) 10 MG tablet Take 10 mg by mouth every evening.       . Cyanocobalamin (B-12 PO) Take 1 tablet by mouth every evening.       Marland Kitchen. Dextromethorphan-Guaifenesin (MUCINEX DM MAXIMUM STRENGTH) 60-1200 MG TB12 Take 1 tablet by mouth 2 (two) times daily.      . fluticasone (FLONASE) 50 MCG/ACT nasal spray Place 2 sprays into both nostrils daily.      . mirtazapine (REMERON) 15 MG tablet Take 15 mg by mouth at bedtime.       . Morphine Sulfate (MORPHINE CONCENTRATE) 10 mg / 0.5 ml concentrated solution Place 0.5 mLs (10 mg total) under the tongue every 3 (three) hours as needed for severe pain or shortness of breath.  120 mL  0  . Multiple Vitamin (MULTIVITAMIN) tablet Take 1 tablet by mouth 2 (two) times daily.      . pantoprazole (PROTONIX) 40 MG tablet Take 40 mg by mouth  daily.      . polyethylene glycol powder (GLYCOLAX/MIRALAX) powder Take 17 g by mouth every morning.      . potassium chloride SA (K-DUR,KLOR-CON) 20 MEQ tablet Take 2 tabs (40 meq total) in the am  90 tablet  3  . predniSONE (DELTASONE) 10 MG tablet Take 40mg  po daily for 3 days then 30mg  po daily for 3 days then 20mg  po daily for 3 days then 10mg  po daily for 3 days then stop  30 tablet  0  . simvastatin (ZOCOR) 40 MG tablet Take 40 mg by mouth daily.       Marland Kitchen. torsemide (DEMADEX) 20 MG tablet Take 2 tablets (40 mg total) by mouth daily.  60 tablet  5  . VENTOLIN HFA 108 (90 BASE) MCG/ACT inhaler Inhale 2 puffs into the lungs every 4 (four) hours as needed.       No current facility-administered medications for this visit.    Past Medical History  Diagnosis Date  . Essential hypertension   . History of pulmonary embolism   . Carotid artery stenosis     Status post CT of head and neck. Right common carotid artery and right internal carotid artery were occluded not contiguous string sign detected.  Prominent irregular calcified plaque noted in the left carotid artery with an estimated 60% diameter stenosis. Also 75% stenosis in the proximal vertebral artery..  . COPD (chronic obstructive pulmonary disease)   . Pulmonary nodules   . Swallowing difficulty   . CAD (coronary artery disease), native coronary artery     Coron[facility] bypass [facility]  . Ischemic cardiomyopathy   . Chronic systolic heart failure   . CKD (chronic kidney disease), stage IV   . Weight loss     Past Surgical History  Procedure Laterality Date  . Cardiac catheterization  2007    Revealing patent grafts  . Coronary artery bypass graft  1995  . Prostate surgery      Hx of bypass prostate infections    WUJ:WJXBJYROS:Review of systems complete and found to be negative unless listed above  PHYSICAL EXAM BP 110/80  Pulse 110  Ht 5\' 7"  (1.702 m)  Wt 169 lb (76.658 kg)  BMI 26.46 kg/m2 General:  Well developed, well nourished, in no acute distress Head: Eyes PERRLA, No xanthomas.   Normal cephalic and atramatic  Lungs: Bilateral crackles in the bases. No wheezes. On room air.  Heart: HRRR S1 S2, without MRG.  Pulses are 2+ & equal.            No carotid bruit. No JVD.  No abdominal bruits. No femoral bruits. Abdomen: Bowel sounds are positive, abdomen soft and non-tender without masses or                  Hernia's noted. Msk:  Back normal, normal gait. Normal strength and tone for age. Extremities: No clubbing, cyanosis or edema.  DP +1 Neuro: Alert and oriented X 3. Psych:  Good affect, responds appropriately     ASSESSMENT AND PLAN

## 2014-01-07 ENCOUNTER — Ambulatory Visit: Payer: Medicare Other | Admitting: Cardiology

## 2014-01-07 ENCOUNTER — Ambulatory Visit (INDEPENDENT_AMBULATORY_CARE_PROVIDER_SITE_OTHER): Payer: Medicare Other | Admitting: Adult Health

## 2014-01-07 ENCOUNTER — Encounter: Payer: Self-pay | Admitting: Adult Health

## 2014-01-07 VITALS — BP 110/80 | HR 110 | Ht 67.0 in | Wt 169.0 lb

## 2014-01-07 DIAGNOSIS — I5041 Acute combined systolic (congestive) and diastolic (congestive) heart failure: Secondary | ICD-10-CM

## 2014-01-07 DIAGNOSIS — I10 Essential (primary) hypertension: Secondary | ICD-10-CM | POA: Diagnosis not present

## 2014-01-07 NOTE — Patient Instructions (Signed)
Your physician recommends that you schedule a follow-up appointment in: 3 months  Your physician recommends that you continue on your current medications as directed. Please refer to the Current Medication list given to you today.  Thank you for choosing [facility]!!    

## 2014-01-07 NOTE — Progress Notes (Deleted)
Name: Brett Baldwin    DOB: 1923-07-07  Age: 80 y.o.  MR#: [id number]       PCP:  Glenda Chroman., MD      Insurance: Payor: [employer] / Plan: BLUE MEDICARE / Product Type: *No Product type* /   CC:    Chief Complaint  Patient presents with  . Hypertension  . Cardiomyopathy    Ischemic    VS Filed Vitals:   01/07/14 1319  BP: 110/80  Pulse: 110  Height: $Remove'5\' 7"'YvkOUam$  (1.702 m)  Weight: 169 lb (76.658 kg)    Weights Current Weight  01/07/14 169 lb (76.658 kg)  12/31/13 170 lb 6.4 oz (77.293 kg)  12/24/13 166 lb (75.297 kg)    Blood Pressure  BP Readings from Last 3 Encounters:  01/07/14 110/80  01/02/14 130/60  12/24/13 142/52     Admit date:  (Not on file) Last encounter with RMR:  Visit date not found   Allergy Review of patient's allergies indicates no known allergies.  Current Outpatient Prescriptions  Medication Sig Dispense Refill  . albuterol (PROVENTIL) (2.5 MG/3ML) 0.083% nebulizer solution Take 3 mLs (2.5 mg total) by nebulization every 6 (six) hours as needed for wheezing or shortness of breath.  75 mL  12  . aspirin EC 81 MG EC tablet Take 1 tablet (81 mg total) by mouth daily.      . bisoprolol (ZEBETA) 5 MG tablet Take 1 tablet (5 mg total) by mouth daily.  30 tablet  1  . cholecalciferol (VITAMIN D) 1000 UNITS tablet Take 1,000 Units by mouth every evening.       . citalopram (CELEXA) 10 MG tablet Take 10 mg by mouth every evening.       . Cyanocobalamin (B-12 PO) Take 1 tablet by mouth every evening.       Marland Kitchen Dextromethorphan-Guaifenesin (MUCINEX DM MAXIMUM STRENGTH) 60-1200 MG TB12 Take 1 tablet by mouth 2 (two) times daily.      . fluticasone (FLONASE) 50 MCG/ACT nasal spray Place 2 sprays into both nostrils daily.      . mirtazapine (REMERON) 15 MG tablet Take 15 mg by mouth at bedtime.       . Morphine Sulfate (MORPHINE CONCENTRATE) 10 mg / 0.5 ml concentrated solution Place 0.5 mLs (10 mg total) under the tongue every 3 (three)  hours as needed for severe pain or shortness of breath.  120 mL  0  . Multiple Vitamin (MULTIVITAMIN) tablet Take 1 tablet by mouth 2 (two) times daily.      . pantoprazole (PROTONIX) 40 MG tablet Take 40 mg by mouth daily.      . polyethylene glycol powder (GLYCOLAX/MIRALAX) powder Take 17 g by mouth every morning.      . potassium chloride SA (K-DUR,KLOR-CON) 20 MEQ tablet Take 2 tabs (40 meq total) in the am  90 tablet  3  . predniSONE (DELTASONE) 10 MG tablet Take $RemoveBef'40mg'ldglhbWdoF$  po daily for 3 days then $RemoveB'30mg'XYVYdmxF$  po daily for 3 days then $RemoveB'20mg'jkBFcXQI$  po daily for 3 days then $RemoveB'10mg'ocGUOuGN$  po daily for 3 days then stop  30 tablet  0  . simvastatin (ZOCOR) 40 MG tablet Take 40 mg by mouth daily.       Marland Kitchen torsemide (DEMADEX) 20 MG tablet Take 2 tablets (40 mg total) by mouth daily.  60 tablet  5  . VENTOLIN HFA 108 (90 BASE) MCG/ACT inhaler Inhale 2 puffs into the lungs every 4 (four) hours as needed.  No current facility-administered medications for this visit.    Discontinued Meds:    Medications Discontinued During This Encounter  Medication Reason  . bisoprolol-hydrochlorothiazide (ZIAC) 5-6.25 MG per tablet Error  . metolazone (ZAROXOLYN) 2.5 MG tablet Error    Patient Active Problem List   Diagnosis Date Noted  . Hypokalemia 12/31/2013  . COPD exacerbation 12/30/2013  . COPD (chronic obstructive pulmonary disease) 12/14/2013  . Acute on chronic systolic CHF (congestive heart failure), NYHA class 3 12/12/2013  . Midsternal chest pain 12/12/2013  . CKD (chronic kidney disease), stage IV   . Chest pain 12/10/2013  . GERD (gastroesophageal reflux disease) 12/10/2013  . Ischemic cardiomyopathy   . Chronic systolic heart failure   . Hypertension   . Chronic kidney disease   . Coronary atherosclerosis of native coronary artery 08/19/2011  . POSTURAL HYPOTENSION 06/03/2009  . CHRONIC KIDNEY DISEASE UNSPECIFIED 04/13/2009  . FLANK PAIN, LEFT 04/13/2009  . Dyslipidemia 03/19/2009  . Essential hypertension,  benign 03/19/2009  . HTN CKD UNS W/CKD STAGE I THRU STAGE IV/UNS 03/19/2009  . Acute combined systolic and diastolic heart failure 24/23/5361  . Occlusion and stenosis of carotid artery without mention of cerebral infarction 03/19/2009  . PVD 03/19/2009  . NONSPECIFIC ABNORMAL UNSPEC CV FUNCTION STUDY 03/19/2009  . PERSONAL HISTORY, VENOUS THROMBOSIS AND EMBOLISM 03/19/2009  . Postsurgical aortocoronary bypass status 03/19/2009    LABS    Component Value Date/Time   NA 140 01/01/2014 0503   NA 140 12/31/2013 0534   NA 140 12/30/2013 1949   K 3.0* 01/01/2014 0503   K 3.2* 12/31/2013 0534   K 3.4* 12/30/2013 1949   CL 99 01/01/2014 0503   CL 99 12/31/2013 0534   CL 96 12/30/2013 1949   CO2 27 01/01/2014 0503   CO2 25 12/31/2013 0534   CO2 27 12/30/2013 1949   GLUCOSE 167* 01/01/2014 0503   GLUCOSE 172* 12/31/2013 0534   GLUCOSE 119* 12/30/2013 1949   BUN 64* 01/01/2014 0503   BUN 60* 12/31/2013 0534   BUN 63* 12/30/2013 1949   CREATININE 2.32* 01/01/2014 0503   CREATININE 2.46* 12/31/2013 0534   CREATININE 2.57* 12/30/2013 1949   CREATININE 2.80* 12/29/2013 1301   CREATININE 3.31* 12/24/2013 1208   CALCIUM 8.3* 01/01/2014 0503   CALCIUM 8.1* 12/31/2013 0534   CALCIUM 8.6 12/30/2013 1949   GFRNONAA 23* 01/01/2014 0503   GFRNONAA 22* 12/31/2013 0534   GFRNONAA 21* 12/30/2013 1949   GFRNONAA 19* 12/29/2013 1301   GFRNONAA 16* 12/24/2013 1208   GFRAA 27* 01/01/2014 0503   GFRAA 25* 12/31/2013 0534   GFRAA 24* 12/30/2013 1949   GFRAA 22* 12/29/2013 1301   GFRAA 18* 12/24/2013 1208   CMP     Component Value Date/Time   NA 140 01/01/2014 0503   K 3.0* 01/01/2014 0503   CL 99 01/01/2014 0503   CO2 27 01/01/2014 0503   GLUCOSE 167* 01/01/2014 0503   BUN 64* 01/01/2014 0503   CREATININE 2.32* 01/01/2014 0503   CREATININE 2.80* 12/29/2013 1301   CALCIUM 8.3* 01/01/2014 0503   PROT 6.4 12/11/2013 1047   ALBUMIN 2.8* 12/11/2013 1047   AST 21 12/11/2013 1047   ALT 17  12/11/2013 1047   ALKPHOS 59 12/11/2013 1047   BILITOT 0.4 12/11/2013 1047   GFRNONAA 23* 01/01/2014 0503   GFRNONAA 19* 12/29/2013 1301   GFRAA 27* 01/01/2014 0503   GFRAA 22* 12/29/2013 1301       Component Value Date/Time   WBC 8.8 12/30/2013  1949   WBC 8.1 12/11/2013 0423   WBC 13.5* 01/13/2013 1556   HGB 12.4* 12/30/2013 1949   HGB 11.4* 12/11/2013 0423   HGB 12.9* 01/13/2013 1556   HCT 36.1* 12/30/2013 1949   HCT 34.1* 12/11/2013 0423   HCT 38.2* 01/13/2013 1556   MCV 91.9 12/30/2013 1949   MCV 90.0 12/11/2013 0423   MCV 93.4 01/13/2013 1556    Lipid Panel     Component Value Date/Time   CHOL 231* 12/11/2013 0440   TRIG 149 12/11/2013 0440   HDL 63 12/11/2013 0440   CHOLHDL 3.7 12/11/2013 0440   VLDL 30 12/11/2013 0440   LDLCALC 138* 12/11/2013 0440    ABG    Component Value Date/Time   TCO2 27 06/28/2009 0818     Lab Results  Component Value Date   TSH 2.640 12/31/2013   BNP (last 3 results)  Recent Labs  12/10/13 2334 12/30/13 1949  PROBNP 6667.0* 5263.0*   Cardiac Panel (last 3 results) No results found for this basename: CKTOTAL, CKMB, TROPONINI, RELINDX,  in the last 72 hours  Iron/TIBC/Ferritin/ %Sat No results found for this basename: iron, tibc, ferritin, ironpctsat     EKG Orders placed during the hospital encounter of 12/30/13  . EKG 12-LEAD  . EKG 12-LEAD  . ED EKG  . ED EKG  . EKG     Prior Assessment and Plan Problem List as of 01/07/2014     Cardiovascular and Mediastinum   Essential hypertension, benign   Last Assessment & Plan   12/24/2013 Office Visit Written 12/24/2013 12:24 PM by Imogene Burn, PA-C     Blood pressure is stable today but has been running low home. Cannot add medication such as Stann [place].    Acute combined systolic and diastolic heart failure   Occlusion and stenosis of carotid artery without mention of cerebral infarction   Last Assessment & Plan   07/08/2012 Office Visit Written 07/08/2012  2:07 PM by Donney Dice, PA-C     Previously deemed not a suitable candidate for aggressive management.    PVD   Last Assessment & Plan   08/30/2011 Office Visit Written 08/30/2011  1:13 PM by Ezra Sites, MD     Suspect patient has claudication. We will obtain ABIs in the hospitalization.    POSTURAL HYPOTENSION   Last Assessment & Plan   08/30/2011 Office Visit Written 08/30/2011  1:11 PM by Ezra Sites, MD     Resolved.    Postsurgical aortocoronary bypass status   Last Assessment & Plan   08/30/2011 Office Visit Written 08/30/2011  1:09 PM by Ezra Sites, MD     He has known severe LV dysfunction. He reports no chest pain. In 2007 autograft were patent but had severe diffuse distal disease. He now has worsened ejection fraction with an EF of less than 20%. Left ventricular diastolic filling pattern was consistent with restrictive filling pattern. There is moderate mitral regurgitation and moderate tricuspid regurgitation as well as pulmonary hypertension. The patient is currently in  low output state with episodes of recurrent congestive heart failure. The patient will be electively admitted tomorrow [facility] hospital for intravenous milrinone as well as adjustment of heart failure medications and additional diuresis. I told the patient to take an extra Lasix of 20 mg today.    Coronary atherosclerosis of native coronary artery   Last Assessment & Plan   08/14/2011 Hospital Encounter Written 08/19/2011  6:13 PM by  Jolaine Artist, MD     No evidence of ischemia. Continue current regimen.      Ischemic cardiomyopathy   Last Assessment & Plan   07/08/2012 Office Visit Edited 07/08/2012  2:09 PM by Donney Dice, PA-C     No further workup currently indicated. Patient recently presented with atypical CP, in the context of herpes zoster infection, currently resolving. His discomfort is exacerbated by movement. Recent troponins were essentially negative. Recommend continued conservative management, as  previously outlined by Dr. Dannielle Burn, including consideration for ICD implantation. Regarding medications, will decrease ASA to 81 mg daily.    Chronic systolic heart failure   Last Assessment & Plan   07/08/2012 Office Visit Written 07/08/2012  2:05 PM by Donney Dice, PA-C     Euvolemic by history and exam. Patient has lost 4 pounds since last OV. He has documented intolerance to Aldactone, secondary to development of gynecomastia.    Hypertension   Acute on chronic systolic CHF (congestive heart failure), NYHA class 3   Last Assessment & Plan   12/24/2013 Office Visit Written 12/24/2013 12:24 PM by Imogene Burn, PA-C     Patient is having ongoing problems controlling his heart failure. He's had to take extra Lasix twice for 3 pound weight gain and shortness of breath. He is been on a downward spiral for the past 2 months. His wife is having trouble taking care of him. I suspect he may need hospice in the near future. Ejection fraction 15%. We'll check be met today. Continue current treatment with Lasix twice a day and when necessary for weight gain and edema. He is getting extra salt so have instructed him on 2 g sodium diet. Followup with Dr. Harl Bowie in one to 2 weeks for furthe discussion on long-term care.r      Respiratory   COPD (chronic obstructive pulmonary disease)   Last Assessment & Plan   12/24/2013 Office Visit Written 12/24/2013 12:24 PM by Imogene Burn, PA-C     Currently on a steroid taper    COPD exacerbation     Digestive   GERD (gastroesophageal reflux disease)     Genitourinary   HTN CKD UNS W/CKD STAGE I THRU STAGE IV/UNS   CHRONIC KIDNEY DISEASE UNSPECIFIED   Chronic kidney disease   Last Assessment & Plan   07/08/2012 Office Visit Written 07/08/2012  2:06 PM by Donney Dice, PA-C     We'll request most recent followup labs from Dr. Woody Seller' office.    CKD (chronic kidney disease), stage IV   Last Assessment & Plan   12/24/2013 Office Visit Written 12/24/2013  12:25 PM by Imogene Burn, PA-C     Check be met today, followup with renal      Other   Dyslipidemia   Last Assessment & Plan   07/08/2012 Office Visit Written 07/08/2012  2:08 PM by Donney Dice, PA-C     On full dose simvastatin, followed by primary M.D. Recommend aggressive management with target LDL 70 or less, if feasible.    FLANK PAIN, LEFT   NONSPECIFIC ABNORMAL UNSPEC CV FUNCTION STUDY   Last Assessment & Plan   08/30/2011 Office Visit Written 08/30/2011  1:10 PM by Ezra Sites, MD     Anterior and apical scar by prior Myoview.    PERSONAL HISTORY, VENOUS THROMBOSIS AND EMBOLISM   Chest pain   Midsternal chest pain   Hypokalemia       Imaging: Dg Chest  2 View  12/12/2013   CLINICAL DATA:  CHF. Patient admitted to the hospital 4 days ago for shortness of breath. History of smoking, COPD, hypertension, ischemic cardiomyopathy, pulmonary embolism. Subsequent encounter.  EXAM: CHEST  2 VIEW  COMPARISON:  12/10/2013; 08/15/2013  FINDINGS: Grossly unchanged enlarged cardiac silhouette and mediastinal contours with atherosclerotic plaque within the thoracic aorta. Post median sternotomy. Overall improved aeration of the lungs. Unchanged mild eventration of the right hemidiaphragm. No pleural effusion or pneumothorax. No evidence of edema. Grossly unchanged focal kyphosis involving the lower thoracic spine. Regional soft tissues appear normal.  IMPRESSION: Improved aeration along suggests resolved edema and atelectasis.   Electronically Signed   By: Sandi Mariscal M.D.   On: 12/12/2013 18:02   US Renal  12/11/2013   CLINICAL DATA:  Decreased urinary output. History of hypertension. Chronic kidney disease.  EXAM: RENAL/URINARY TRACT ULTRASOUND COMPLETE  COMPARISON:  CT 12/08/2010  FINDINGS: Right Kidney:  Length: 8.3 cm. The renal cortex is thin and increased in echogenicity. No hydronephrosis. There are multiple hypoechoic lesions are demonstrated throughout renal cortex, the largest of  which measures 1.5 cm within the interpolar region of the right kidney. These are not able to be definitively characterized on current examination.  Left Kidney:  Length: 13.4 cm. The renal cortex is thin and increased in echogenicity. There is a dominant cyst within the inferior pole of the left kidney measuring up to 9.4 cm. Multiple additional hypoechoic lesions within the left kidney are not able to be definitively characterized on current examination.  Bladder:  Appears normal for degree of bladder distention.  IMPRESSION: No hydronephrosis.  Bilateral renal cortical thinning and increased echogenicity compatible with chronic renal disease.  Multiple bilateral hypoechoic renal lesions, not definitely characterized on current examination. Patient has known bilateral renal cysts.   Electronically Signed   By: Lovey Newcomer M.D.   On: 12/11/2013 16:48   Nm Pulmonary Perf And Vent  12/11/2013   ADDENDUM REPORT: 12/11/2013 19:47  ADDENDUM: The patient's physician states that she has low clinical suspicion of pulmonary embolus. This information, as well as reconsideration of the significance of the defect seen involving the superior segment of left lower lobe, suggests low probability of pulmonary embolus.   Electronically Signed   By: Sabino Dick M.D.   On: 12/11/2013 19:47   12/11/2013   CLINICAL DATA:  Shortness of breath.  Elevated D-dimer level.  EXAM: NUCLEAR MEDICINE VENTILATION - PERFUSION LUNG SCAN  TECHNIQUE: Ventilation images were obtained in multiple projections using inhaled aerosol technetium 99 M DTPA. Perfusion images were obtained in multiple projections after intravenous injection of Tc-63m MAA.  RADIOPHARMACEUTICALS:  40.0 mCi Tc-54m DTPA aerosol and 6.0 mCi Tc-15m MAA  COMPARISON:  Chest radiograph of December 10, 2013.  FINDINGS: Ventilation: Multiple small subsegmental defects are noted throughout the right lung. Large defect is seen involving most of the left upper lobe.  Perfusion:  Moderate to large defect is seen involving the posterior portion of the left upper lobe correspond and the defects seen on ventilation study. Another moderate defect is seen involving the apex of the left upper lobe.  IMPRESSION: To match moderate to large defects are seen involving the left upper lobe consistent for intermediate probability of pulmonary embolus.  Electronically Signed: By: Sabino Dick M.D. On: 12/11/2013 15:58   Dg Chest Port 1 View  12/30/2013   CLINICAL DATA:  80 year old male acute shortness of Breath. Initial encounter. Personal history of emphysema.  EXAM: PORTABLE CHEST -  1 VIEW  COMPARISON:  12/12/2013 and earlier.  FINDINGS: Portable AP upright view at 2002 hrs. Lower lung volumes with crowding of lung markings. Stable cardiac size and mediastinal contours. No pneumothorax. No definite pleural effusion or pulmonary edema. No consolidation.  IMPRESSION: Low lung volumes, otherwise no acute cardiopulmonary abnormality.   Electronically Signed   By: Lars Pinks M.D.   On: 12/30/2013 20:20   Dg Chest Port 1 View  12/11/2013   CLINICAL DATA:  Shortness of breath, left chest pain  EXAM: PORTABLE CHEST - 1 VIEW  COMPARISON:  [facility] chest radiograph dated 12/10/2013 at 1641 hrs.  FINDINGS: Cardiomegaly with pulmonary vascular congestion and possible mild interstitial edema. No pleural effusion or pneumothorax.  Postsurgical changes related to prior CABG.  IMPRESSION: Cardiomegaly with pulmonary vascular congestion and possible mild interstitial edema.   Electronically Signed   By: Julian Hy M.D.   On: 12/11/2013 00:43

## 2014-01-07 NOTE — Assessment & Plan Note (Signed)
Controlled currently. No changes in his medications.

## 2014-01-07 NOTE — Assessment & Plan Note (Signed)
Currently well compensated. He is at baseline concerning weight and breathing status. He is not wearing O2 in the exam room. I have advised him to wear all the time to avoid getting hypoxic.  He questions the need to take morphine. I have discussed use of morphine at low doses to assist with breathing. He is provided this by Hospice.

## 2014-01-21 ENCOUNTER — Other Ambulatory Visit: Payer: Self-pay | Admitting: Cardiovascular Disease

## 2014-02-02 ENCOUNTER — Telehealth: Payer: Self-pay | Admitting: Cardiology

## 2014-02-02 NOTE — Telephone Encounter (Signed)
Husband is in Hospice and she has several questions that she would like to ask Dr Wyline MoodBranch

## 2014-02-03 NOTE — Telephone Encounter (Signed)
Brett ClevelandLavina (wife) wants to make sure she is making right decision.  Was put in Hospice services at most recent hospitalization on 12/30/2013.  Did see Joni ReiningKathryn Lawrence, NP on 01/07/14 for follow up as well.  Stated that she understands that there may not be anything else to do to help husband, but would give her comfort knowing that she has done everything she can for him.  Please advise.

## 2014-02-03 NOTE — Telephone Encounter (Signed)
Can we give them an appointment with me in the next 2-3 weeks  Dominga FerryJ Edda Orea MD

## 2014-02-04 NOTE — Telephone Encounter (Signed)
Wife notified.  Stated that she does not feel that he could come to office.  Stated that nurse came this morning to give him a bath, very weak, couldn't sit up for long.   Wife would really like for you to call her if possible.

## 2014-03-13 DEATH — deceased

## 2014-03-30 ENCOUNTER — Ambulatory Visit: Payer: Medicare Other | Admitting: Cardiology

## 2016-07-31 IMAGING — CR DG CHEST 1V PORT
1 series · 1 of 1 positions shown · non-contrast
Comparison: [facility] chest radiograph dated 12/10/2013 at 7467 hrs.

CLINICAL DATA: Shortness of breath, left chest pain

EXAM:
PORTABLE CHEST - 1 VIEW

[portable]
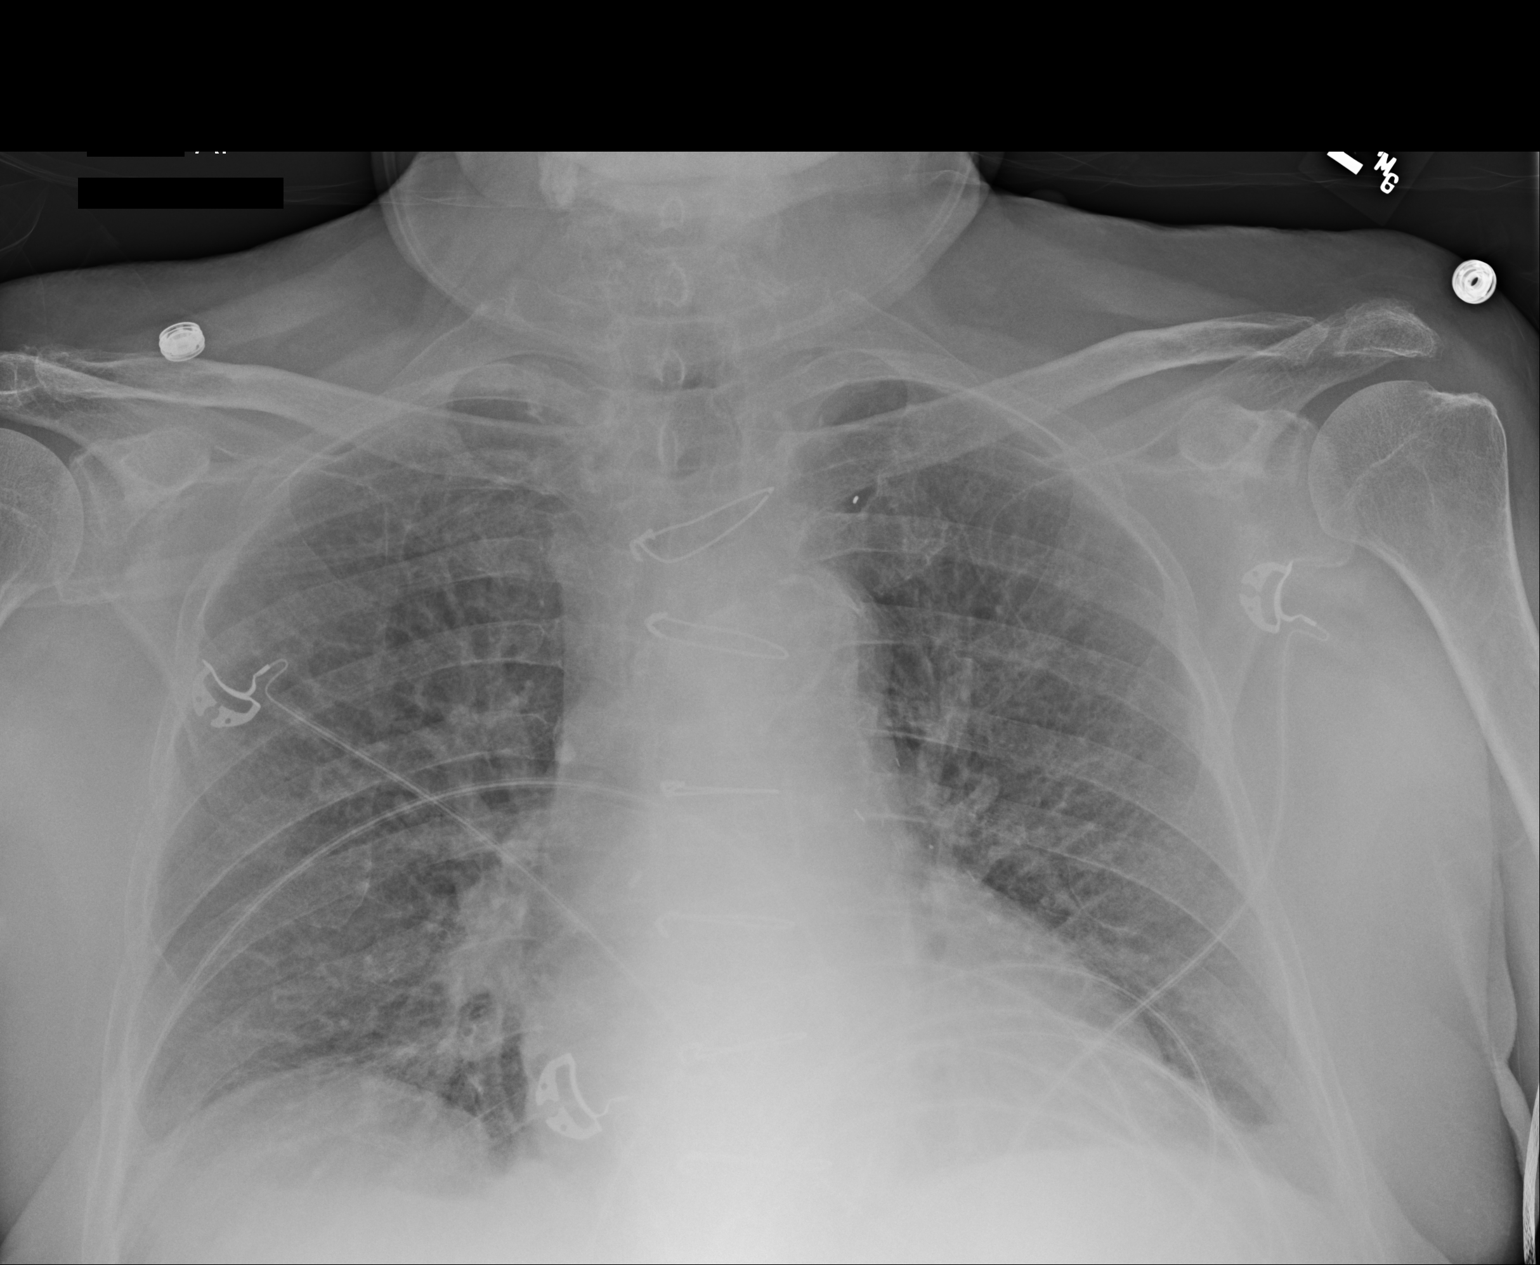

[1 of 1 positions shown; findings below may reference images not displayed]

FINDINGS: Cardiomegaly with pulmonary vascular congestion and possible mild
interstitial edema. No pleural effusion or pneumothorax.

Postsurgical changes related to prior CABG.
IMPRESSION: Cardiomegaly with pulmonary vascular congestion and possible mild
interstitial edema.

## 2016-08-20 IMAGING — CR DG CHEST 1V PORT
1 series · 1 of 1 positions shown · non-contrast
Comparison: 12/12/2013 and earlier.

CLINICAL DATA: 89-year-old male acute shortness of Breath. Initial
encounter. Personal history of emphysema.

EXAM:
PORTABLE CHEST - 1 VIEW

[pa]
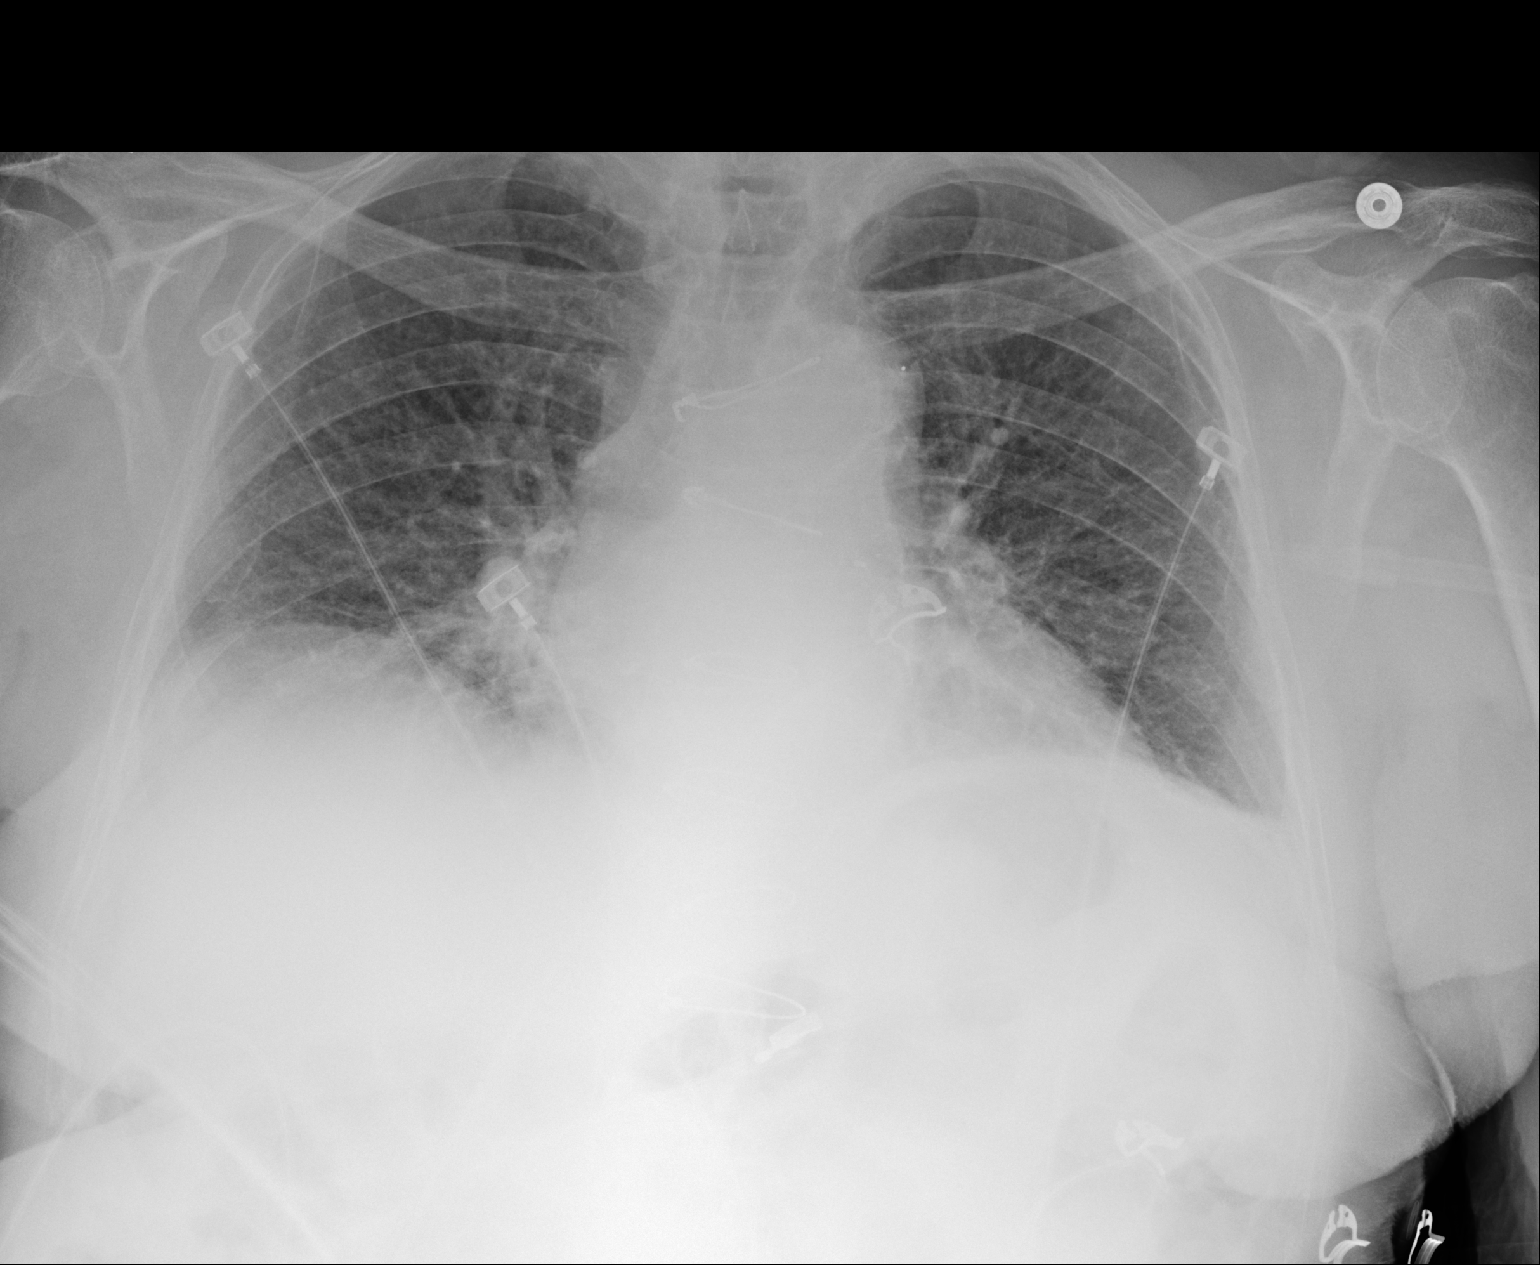

[1 of 1 positions shown; findings below may reference images not displayed]

FINDINGS: Portable AP upright view at 0660 hrs. Lower lung volumes with
crowding of lung markings. Stable cardiac size and mediastinal
contours. No pneumothorax. No definite pleural effusion or pulmonary
edema. No consolidation.
IMPRESSION: Low lung volumes, otherwise no acute cardiopulmonary abnormality.
